# Patient Record
Sex: Female | Born: 1980 | Race: Black or African American | Hispanic: No | Marital: Single | Smoking: Former smoker
Health system: Southern US, Community
[De-identification: ages and names within clinical notes are randomized; demographics above are authoritative.]

## PROBLEM LIST (undated history)

## (undated) DIAGNOSIS — K279 Peptic ulcer, site unspecified, unspecified as acute or chronic, without hemorrhage or perforation: Secondary | ICD-10-CM

## (undated) DIAGNOSIS — Z8619 Personal history of other infectious and parasitic diseases: Secondary | ICD-10-CM

## (undated) DIAGNOSIS — N809 Endometriosis, unspecified: Secondary | ICD-10-CM

## (undated) DIAGNOSIS — F319 Bipolar disorder, unspecified: Secondary | ICD-10-CM

## (undated) HISTORY — DX: Personal history of other infectious and parasitic diseases: Z86.19

## (undated) HISTORY — PX: COLPOSCOPY: SHX161

## (undated) HISTORY — DX: Endometriosis, unspecified: N80.9

## (undated) HISTORY — DX: Bipolar disorder, unspecified: F31.9

## (undated) HISTORY — DX: Peptic ulcer, site unspecified, unspecified as acute or chronic, without hemorrhage or perforation: K27.9

---

## 1999-06-16 ENCOUNTER — Emergency Department (HOSPITAL_COMMUNITY): Admission: EM | Admit: 1999-06-16 | Discharge: 1999-06-17 | Payer: Self-pay | Admitting: Emergency Medicine

## 1999-08-25 ENCOUNTER — Emergency Department (HOSPITAL_COMMUNITY): Admission: EM | Admit: 1999-08-25 | Discharge: 1999-08-25 | Payer: Self-pay | Admitting: *Deleted

## 1999-12-17 ENCOUNTER — Other Ambulatory Visit: Admission: RE | Admit: 1999-12-17 | Discharge: 1999-12-17 | Payer: Self-pay | Admitting: Family Medicine

## 2000-03-09 HISTORY — PX: INDUCED ABORTION: SHX677

## 2000-06-16 ENCOUNTER — Inpatient Hospital Stay (HOSPITAL_COMMUNITY): Admission: AD | Admit: 2000-06-16 | Discharge: 2000-06-16 | Payer: Self-pay | Admitting: Obstetrics

## 2000-06-19 ENCOUNTER — Inpatient Hospital Stay (HOSPITAL_COMMUNITY): Admission: AD | Admit: 2000-06-19 | Discharge: 2000-06-19 | Payer: Self-pay | Admitting: Obstetrics

## 2002-05-11 ENCOUNTER — Emergency Department (HOSPITAL_COMMUNITY): Admission: EM | Admit: 2002-05-11 | Discharge: 2002-05-11 | Payer: Self-pay | Admitting: Emergency Medicine

## 2003-02-26 ENCOUNTER — Emergency Department (HOSPITAL_COMMUNITY): Admission: EM | Admit: 2003-02-26 | Discharge: 2003-02-26 | Payer: Self-pay | Admitting: Emergency Medicine

## 2003-08-17 ENCOUNTER — Inpatient Hospital Stay (HOSPITAL_COMMUNITY): Admission: AD | Admit: 2003-08-17 | Discharge: 2003-08-17 | Payer: Self-pay | Admitting: *Deleted

## 2004-10-07 ENCOUNTER — Ambulatory Visit: Payer: Self-pay | Admitting: Family Medicine

## 2004-10-07 ENCOUNTER — Other Ambulatory Visit: Admission: RE | Admit: 2004-10-07 | Discharge: 2004-10-07 | Payer: Self-pay | Admitting: Family Medicine

## 2004-12-23 ENCOUNTER — Ambulatory Visit: Payer: Self-pay | Admitting: Family Medicine

## 2005-02-26 ENCOUNTER — Ambulatory Visit: Payer: Self-pay | Admitting: Family Medicine

## 2005-03-05 ENCOUNTER — Ambulatory Visit: Payer: Self-pay | Admitting: Family Medicine

## 2005-03-16 ENCOUNTER — Ambulatory Visit: Payer: Self-pay | Admitting: Family Medicine

## 2005-06-15 ENCOUNTER — Ambulatory Visit: Payer: Self-pay | Admitting: Family Medicine

## 2005-09-14 ENCOUNTER — Ambulatory Visit: Payer: Self-pay | Admitting: Family Medicine

## 2005-10-13 ENCOUNTER — Emergency Department: Payer: Self-pay

## 2005-10-13 ENCOUNTER — Other Ambulatory Visit: Payer: Self-pay

## 2005-11-11 ENCOUNTER — Emergency Department (HOSPITAL_COMMUNITY): Admission: EM | Admit: 2005-11-11 | Discharge: 2005-11-11 | Payer: Self-pay | Admitting: Emergency Medicine

## 2005-11-13 ENCOUNTER — Ambulatory Visit: Payer: Self-pay | Admitting: Family Medicine

## 2005-11-24 ENCOUNTER — Ambulatory Visit: Payer: Self-pay | Admitting: Family Medicine

## 2005-12-14 ENCOUNTER — Ambulatory Visit: Payer: Self-pay | Admitting: Family Medicine

## 2005-12-14 ENCOUNTER — Other Ambulatory Visit: Admission: RE | Admit: 2005-12-14 | Discharge: 2005-12-14 | Payer: Self-pay | Admitting: Family Medicine

## 2005-12-14 ENCOUNTER — Encounter: Payer: Self-pay | Admitting: Family Medicine

## 2005-12-14 LAB — CONVERTED CEMR LAB: Pap Smear: NORMAL

## 2006-03-15 ENCOUNTER — Ambulatory Visit: Payer: Self-pay | Admitting: Family Medicine

## 2006-04-15 ENCOUNTER — Ambulatory Visit: Payer: Self-pay | Admitting: Family Medicine

## 2006-06-07 ENCOUNTER — Ambulatory Visit: Payer: Self-pay | Admitting: Family Medicine

## 2006-08-19 ENCOUNTER — Encounter: Payer: Self-pay | Admitting: Family Medicine

## 2006-08-19 DIAGNOSIS — K3189 Other diseases of stomach and duodenum: Secondary | ICD-10-CM | POA: Insufficient documentation

## 2006-08-19 DIAGNOSIS — R1013 Epigastric pain: Secondary | ICD-10-CM

## 2006-08-23 ENCOUNTER — Ambulatory Visit: Payer: Self-pay | Admitting: Family Medicine

## 2006-11-15 ENCOUNTER — Ambulatory Visit: Payer: Self-pay | Admitting: Family Medicine

## 2007-01-17 ENCOUNTER — Ambulatory Visit: Payer: Self-pay | Admitting: Family Medicine

## 2007-01-17 DIAGNOSIS — N760 Acute vaginitis: Secondary | ICD-10-CM | POA: Insufficient documentation

## 2007-01-17 DIAGNOSIS — R300 Dysuria: Secondary | ICD-10-CM | POA: Insufficient documentation

## 2007-01-17 LAB — CONVERTED CEMR LAB
Bilirubin Urine: NEGATIVE
Blood in Urine, dipstick: NEGATIVE
Casts: 0 /lpf
Glucose, Urine, Semiquant: NEGATIVE
Ketones, urine, test strip: NEGATIVE
Nitrite: NEGATIVE
RBC / HPF: 0
Specific Gravity, Urine: >=1.03
Urine crystals, microscopic: 0 /hpf
Urobilinogen, UA: 0.2
WBC Urine, dipstick: NEGATIVE
Yeast, UA: 0
pH: 6

## 2007-01-18 ENCOUNTER — Encounter: Payer: Self-pay | Admitting: Family Medicine

## 2007-01-19 LAB — CONVERTED CEMR LAB
Chlamydia, DNA Probe: NEGATIVE
GC Probe Amp, Genital: NEGATIVE

## 2007-02-14 ENCOUNTER — Other Ambulatory Visit: Admission: RE | Admit: 2007-02-14 | Discharge: 2007-02-14 | Payer: Self-pay | Admitting: Family Medicine

## 2007-02-14 ENCOUNTER — Ambulatory Visit: Payer: Self-pay | Admitting: Family Medicine

## 2007-02-14 ENCOUNTER — Encounter: Payer: Self-pay | Admitting: Family Medicine

## 2007-02-14 DIAGNOSIS — Z87891 Personal history of nicotine dependence: Secondary | ICD-10-CM | POA: Insufficient documentation

## 2007-02-15 LAB — CONVERTED CEMR LAB
ALT: 35 units/L (ref 0–35)
AST: 28 units/L (ref 0–37)
Albumin: 4 g/dL (ref 3.5–5.2)
Alkaline Phosphatase: 61 units/L (ref 39–117)
BUN: 10 mg/dL (ref 6–23)
Basophils Absolute: 0 10*3/uL (ref 0.0–0.1)
Basophils Relative: 0.1 % (ref 0.0–1.0)
Bilirubin, Direct: 0.1 mg/dL (ref 0.0–0.3)
CO2: 32 meq/L (ref 19–32)
Calcium: 9.7 mg/dL (ref 8.4–10.5)
Chloride: 95 meq/L — ABNORMAL LOW (ref 96–112)
Cholesterol: 263 mg/dL (ref 0–200)
Creatinine, Ser: 0.8 mg/dL (ref 0.4–1.2)
Direct LDL: 180 mg/dL
Eosinophils Absolute: 0 10*3/uL (ref 0.0–0.6)
Eosinophils Relative: 0.2 % (ref 0.0–5.0)
GFR calc Af Amer: 110 mL/min
GFR calc non Af Amer: 91 mL/min
Glucose, Bld: 86 mg/dL (ref 70–99)
HCT: 39.1 % (ref 36.0–46.0)
HDL: 62.7 mg/dL (ref >39.0)
Hemoglobin: 13.4 g/dL (ref 12.0–15.0)
Lymphocytes Relative: 40.2 % (ref 12.0–46.0)
MCHC: 34.4 g/dL (ref 30.0–36.0)
MCV: 96 fL (ref 78.0–100.0)
Monocytes Absolute: 0.6 10*3/uL (ref 0.2–0.7)
Monocytes Relative: 9.2 % (ref 3.0–11.0)
Neutro Abs: 3.3 10*3/uL (ref 1.4–7.7)
Neutrophils Relative %: 50.3 % (ref 43.0–77.0)
Platelets: 273 10*3/uL (ref 150–400)
Potassium: 4.5 meq/L (ref 3.5–5.1)
RBC: 4.07 M/uL (ref 3.87–5.11)
RDW: 13.1 % (ref 11.5–14.6)
Sodium: 132 meq/L — ABNORMAL LOW (ref 135–145)
TSH: 0.6 microintl units/mL (ref 0.35–5.50)
Total Bilirubin: 0.6 mg/dL (ref 0.3–1.2)
Total CHOL/HDL Ratio: 4.2
Total Protein: 6.9 g/dL (ref 6.0–8.3)
Triglycerides: 107 mg/dL (ref 0–149)
VLDL: 21 mg/dL (ref 0–40)
WBC: 6.5 10*3/uL (ref 4.5–10.5)

## 2007-02-17 ENCOUNTER — Encounter (INDEPENDENT_AMBULATORY_CARE_PROVIDER_SITE_OTHER): Payer: Self-pay | Admitting: *Deleted

## 2007-02-17 LAB — CONVERTED CEMR LAB: Pap Smear: NORMAL

## 2007-11-24 ENCOUNTER — Encounter: Payer: Self-pay | Admitting: Family Medicine

## 2008-01-20 ENCOUNTER — Inpatient Hospital Stay (HOSPITAL_COMMUNITY): Admission: AD | Admit: 2008-01-20 | Discharge: 2008-01-20 | Payer: Self-pay | Admitting: Obstetrics & Gynecology

## 2008-02-20 ENCOUNTER — Inpatient Hospital Stay (HOSPITAL_COMMUNITY): Admission: AD | Admit: 2008-02-20 | Discharge: 2008-02-20 | Payer: Self-pay | Admitting: Obstetrics and Gynecology

## 2008-09-15 ENCOUNTER — Inpatient Hospital Stay (HOSPITAL_COMMUNITY): Admission: AD | Admit: 2008-09-15 | Discharge: 2008-09-17 | Payer: Self-pay | Admitting: Obstetrics & Gynecology

## 2009-10-18 IMAGING — US US OB COMP LESS 14 WK
1 series · 14 of 28 positions shown · non-contrast
Comparison: None

CLINICAL DATA: *Abdominal pain Pregnancy; ; ABDOMINAL PAIN.

OBSTETRIC <14 WK US AND TRANSVAGINAL OB US
TECHNIQUE: Both transabdominal and transvaginal ultrasound
examinations were performed for complete evaluation of the
gestation as well as the maternal uterus, adnexal regions, and
pelvic cul-de-sac.

[Series 1: us ob comp less 14 wk · 0.26mm/px · 14 of 41 slices shown]
[im 2/41]
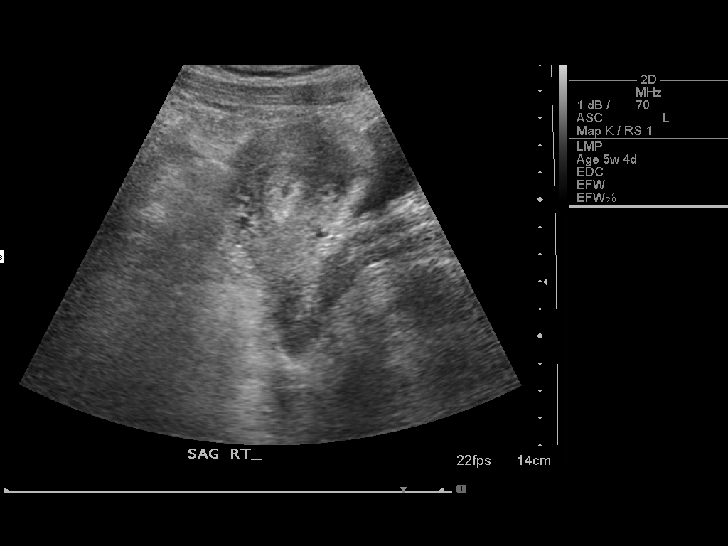
[im 5/41]
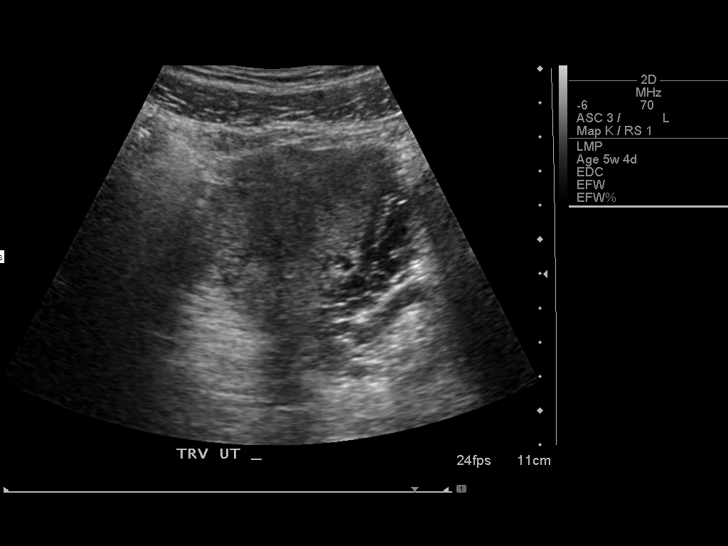
[im 8/41]
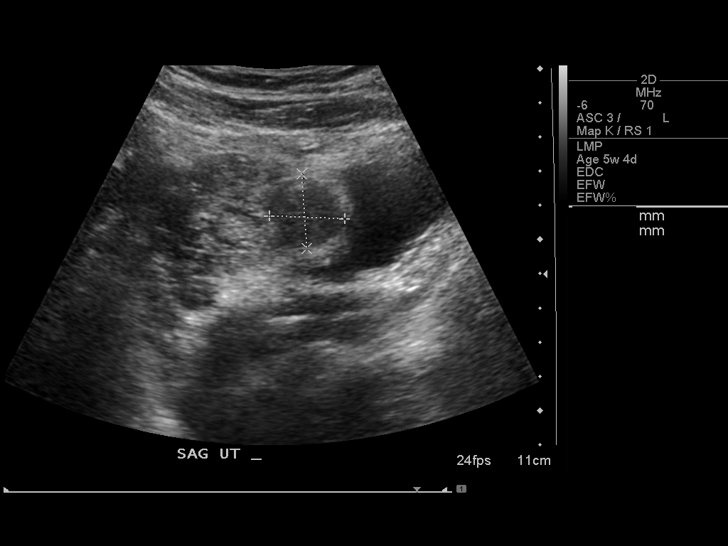
[im 11/41]
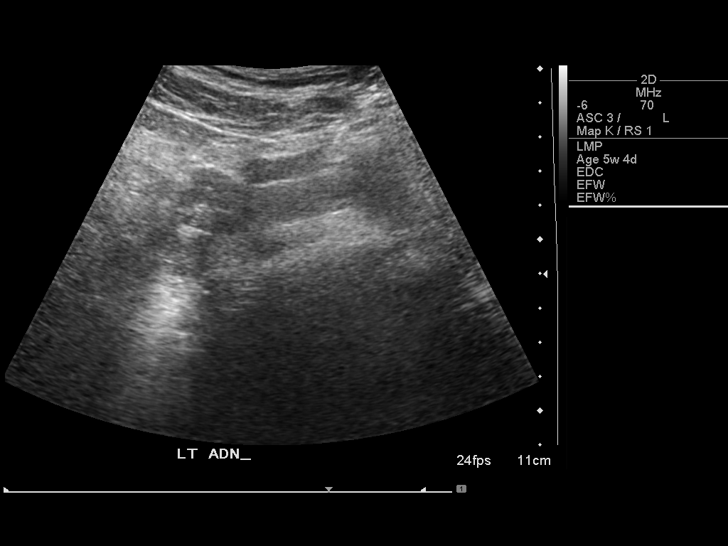
[im 14/41]
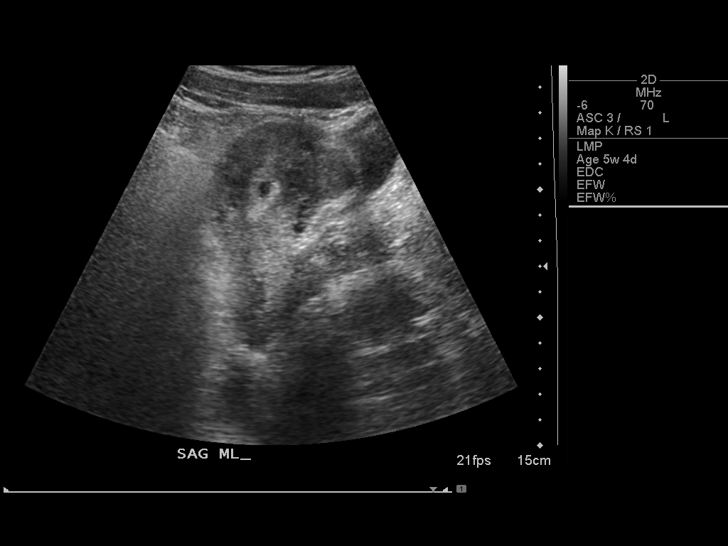
[im 17/41]
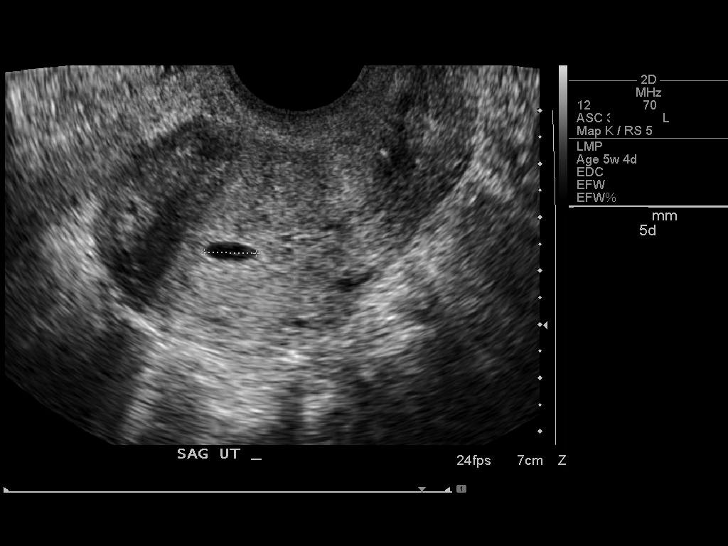
[im 20/41]
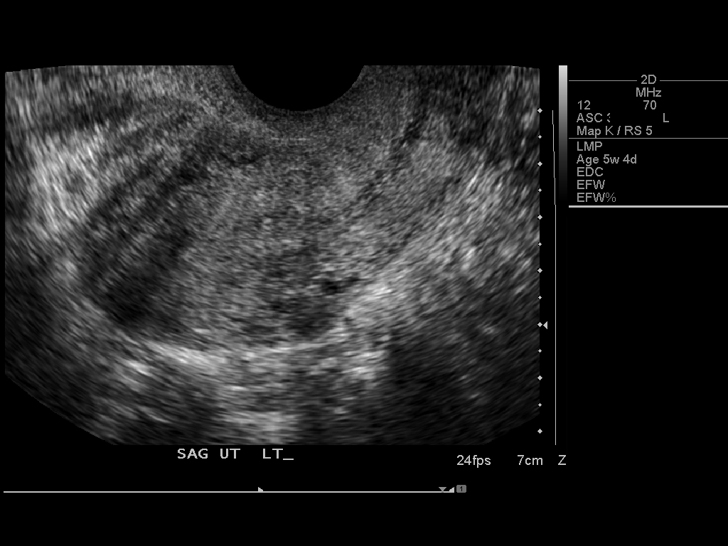
[im 23/41]
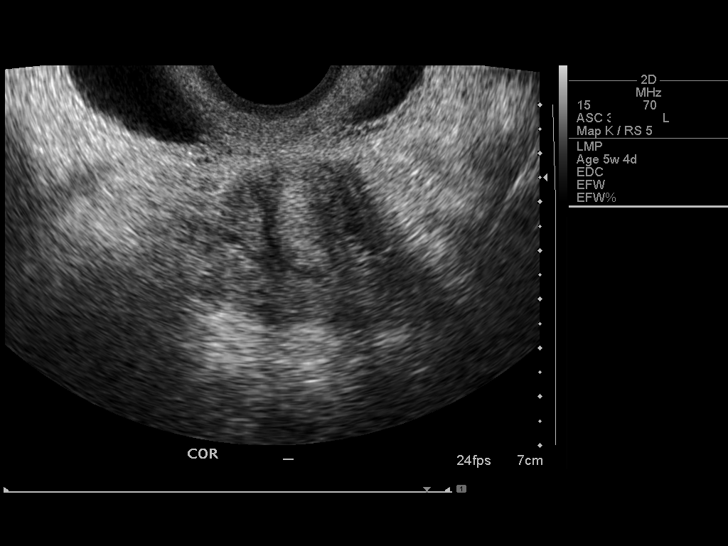
[im 26/41]
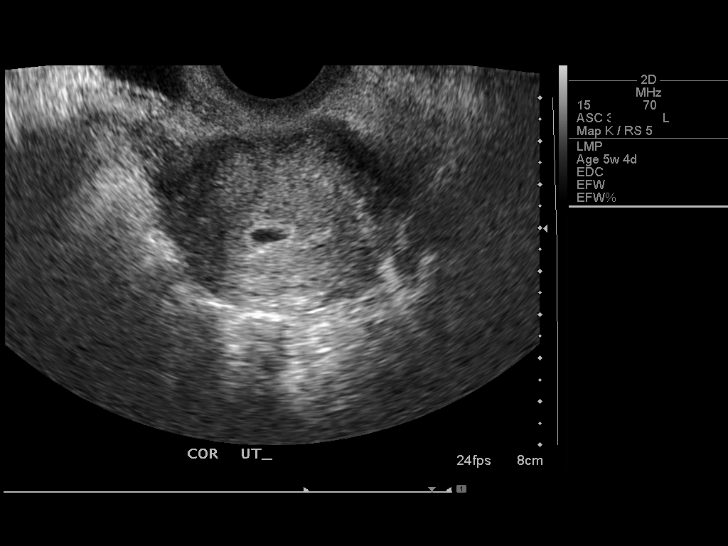
[im 29/41]
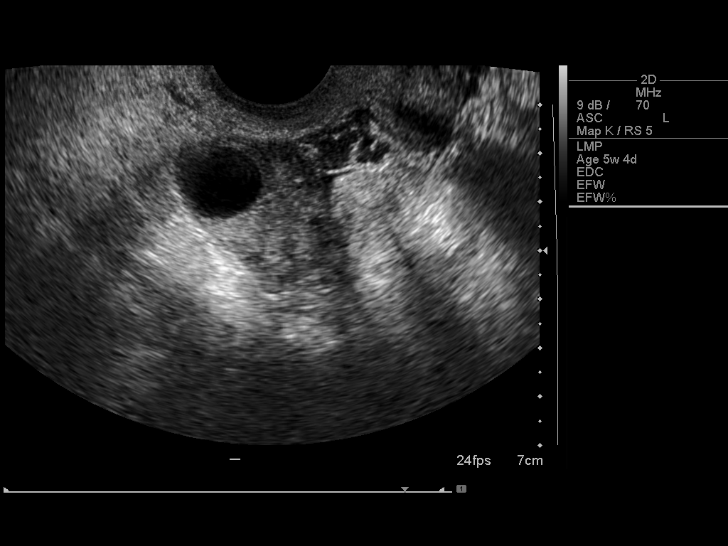
[im 32/41]
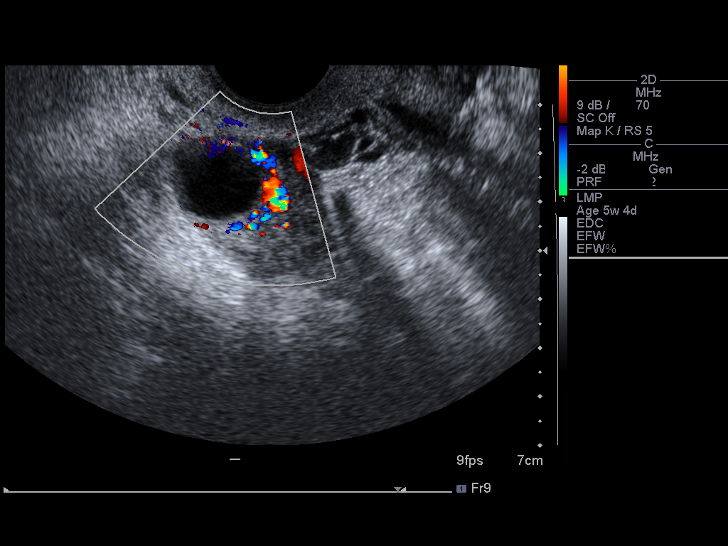
[im 35/41]
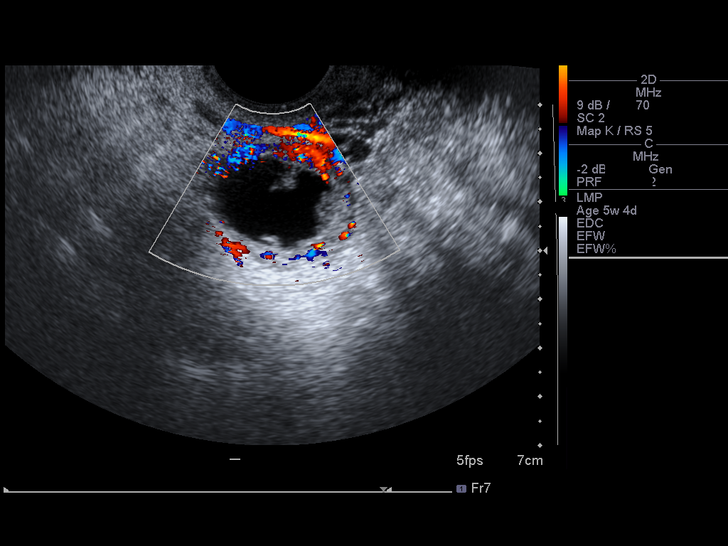
[im 38/41]
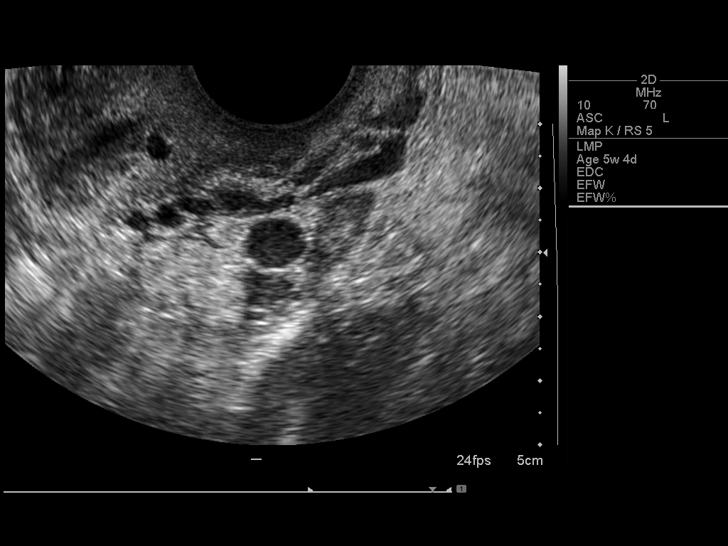
[im 41/41]
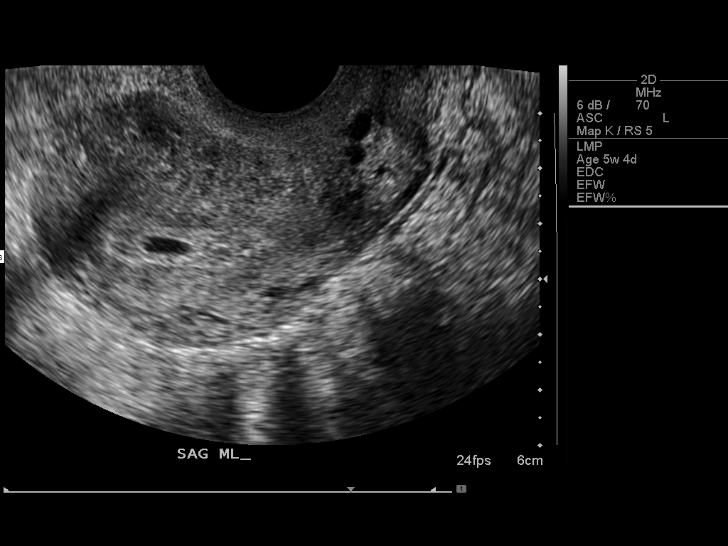

[14 of 28 positions shown; findings below may reference images not displayed]

FINDINGS: There is a single intrauterine gestation.  Mean sac
diameter is 7.4 mm for estimated gestational age of 5 weeks 3 days.
Yolk sac is visualized.  No embryo currently visible.  No
subchorionic hemorrhage.

Right corpus luteum cyst noted.  Ovaries unremarkable.  No free
fluid.

There is a 2.2 cm left fundal fibroid noted.
IMPRESSION: Early intrauterine pregnancy, 5 weeks 3 days by mean sac diameter.
Embryo not visualized currently.

## 2009-11-18 IMAGING — US US OB TRANSVAGINAL
1 series · 17 of 17 positions shown · non-contrast
Comparison: none

OBSTETRICAL ULTRASOUND:
 This ultrasound exam was performed in the [HOSPITAL] Ultrasound Department.  The OB US report was generated in the AS system, and faxed to the ordering physician.  This report is also available in [REDACTED] PACS.

[Series 1: us ob comp less 14 wks · 17 of 17 slices shown]
[im 1/17]
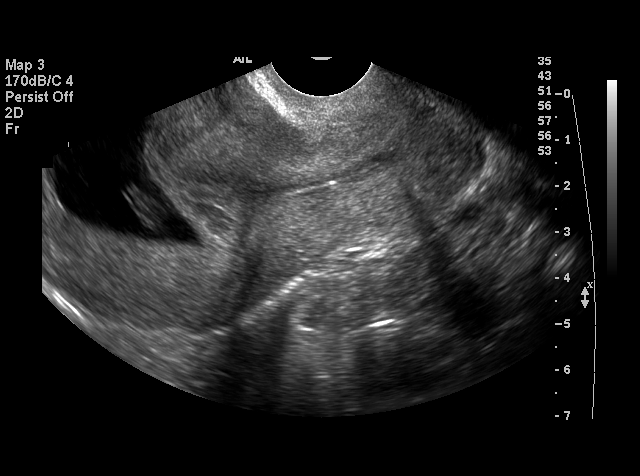
[im 2/17]
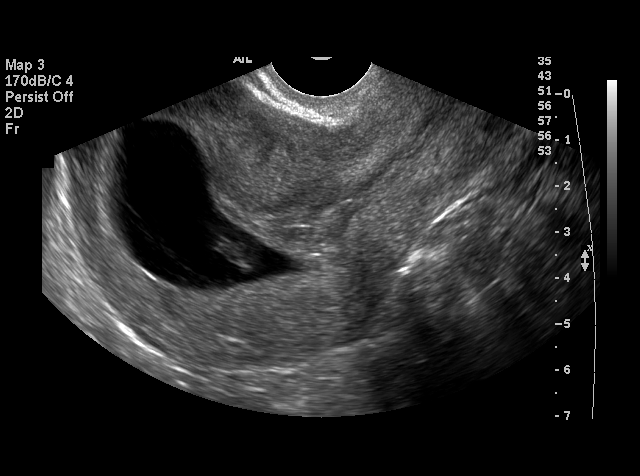
[im 3/17]
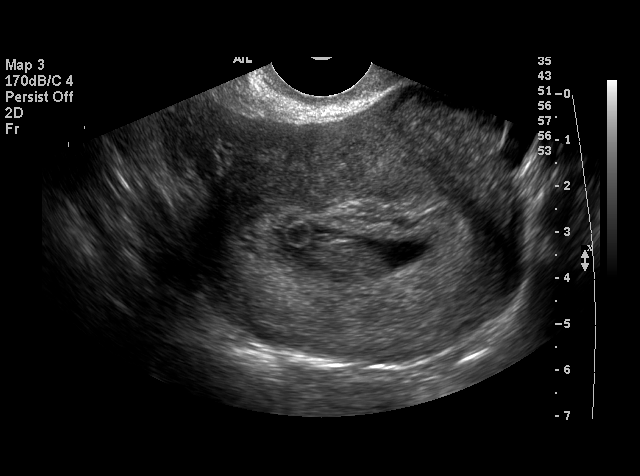
[im 4/17]
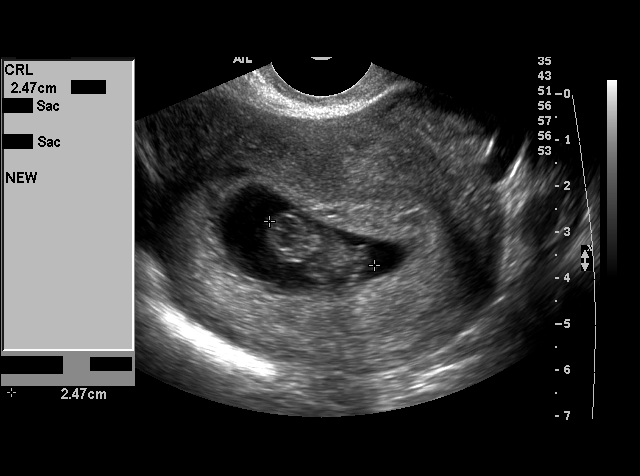
[im 5/17]
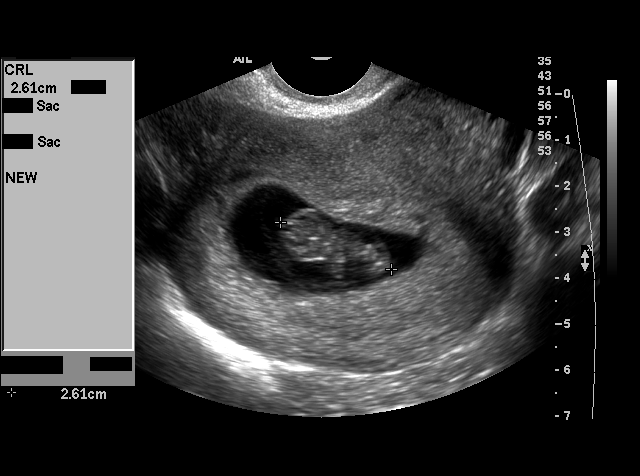
[im 6/17]
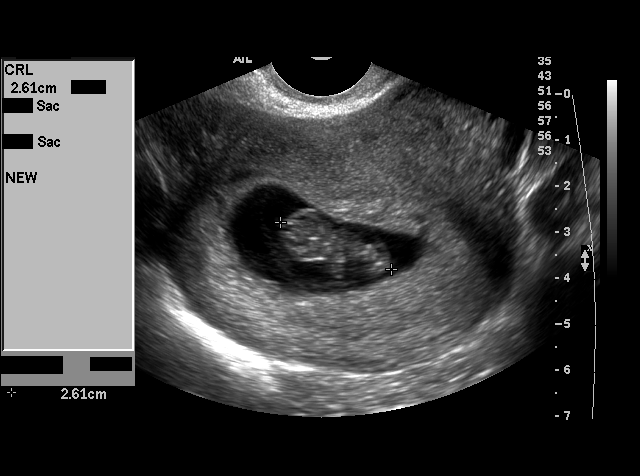
[im 7/17]
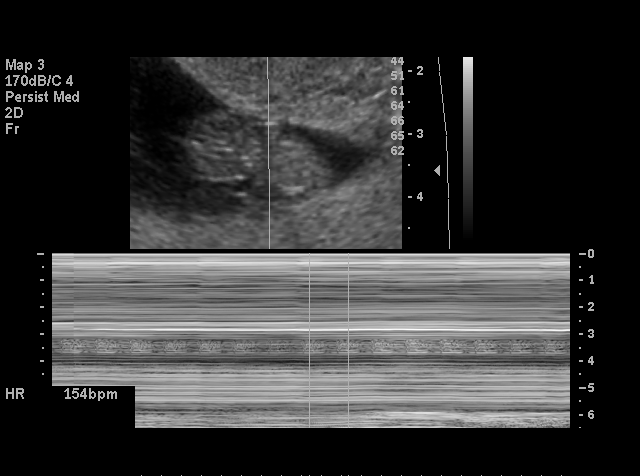
[im 8/17]
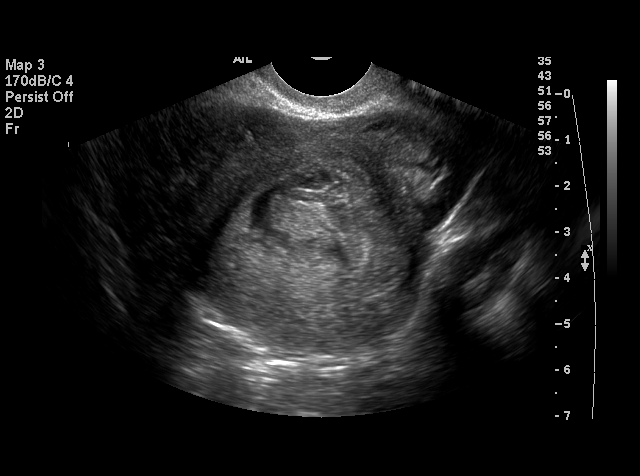
[im 9/17]
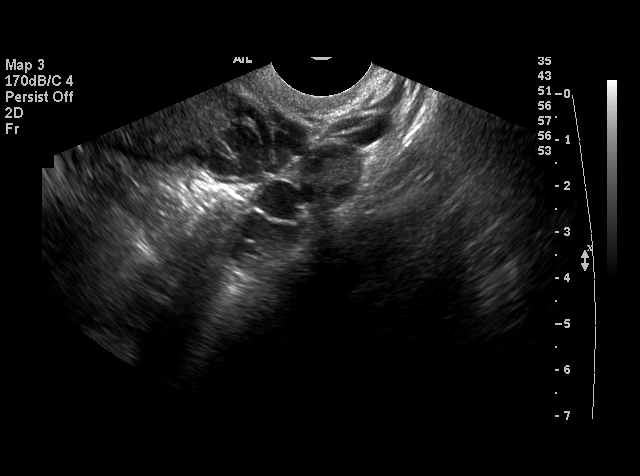
[im 10/17]
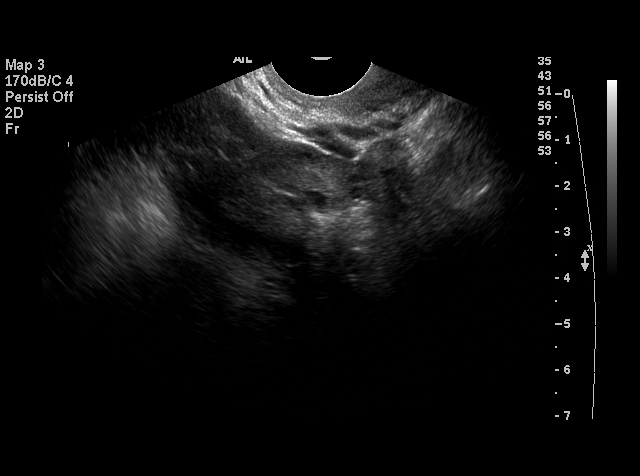
[im 11/17]
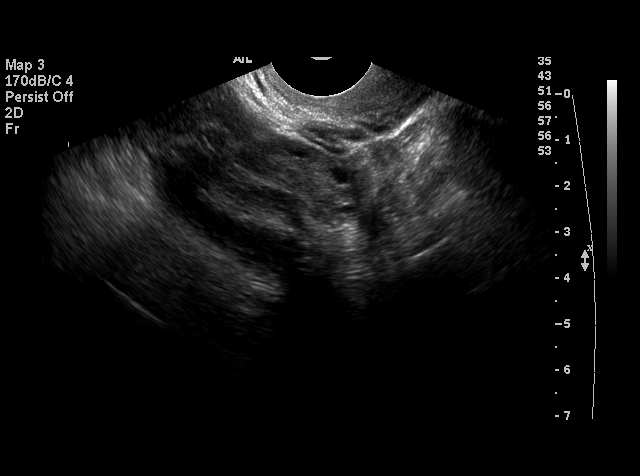
[im 12/17]
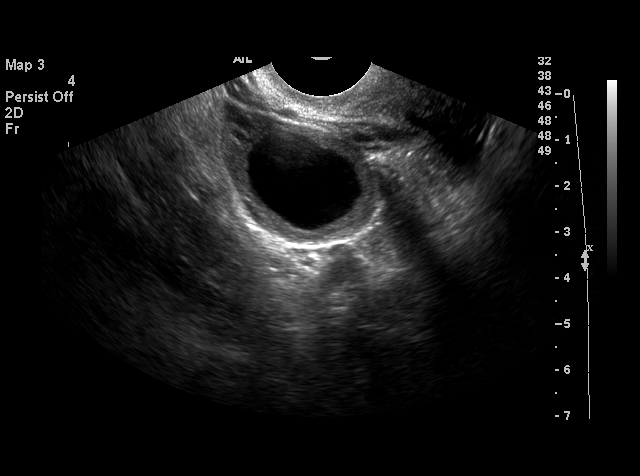
[im 13/17]
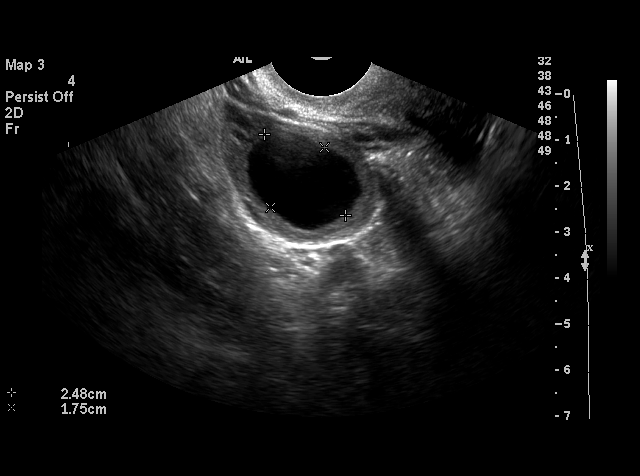
[im 14/17]
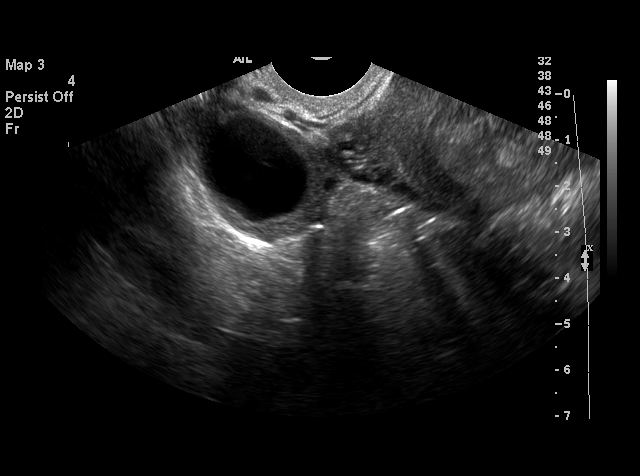
[im 15/17]
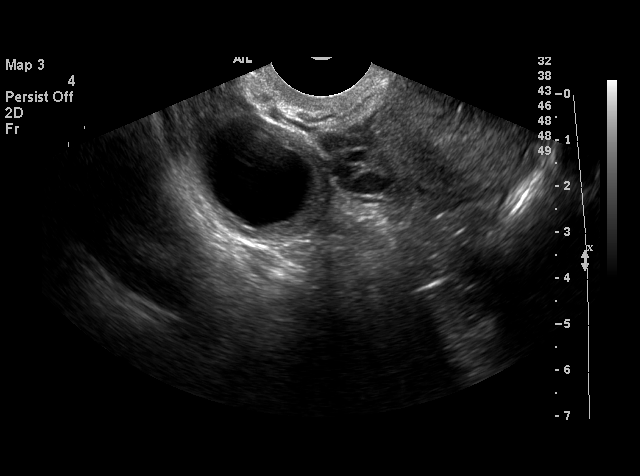
[im 16/17]
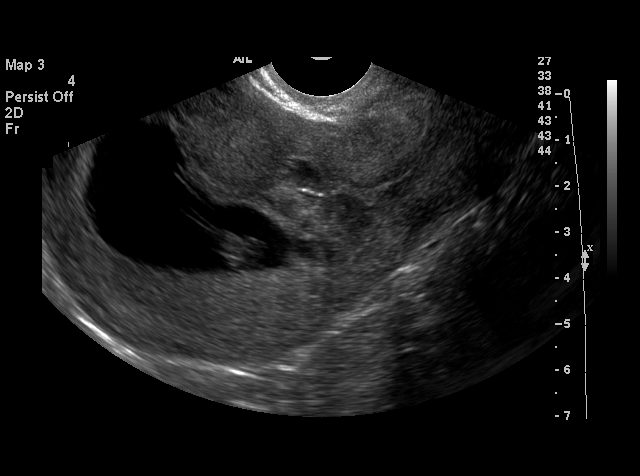
[im 17/17]
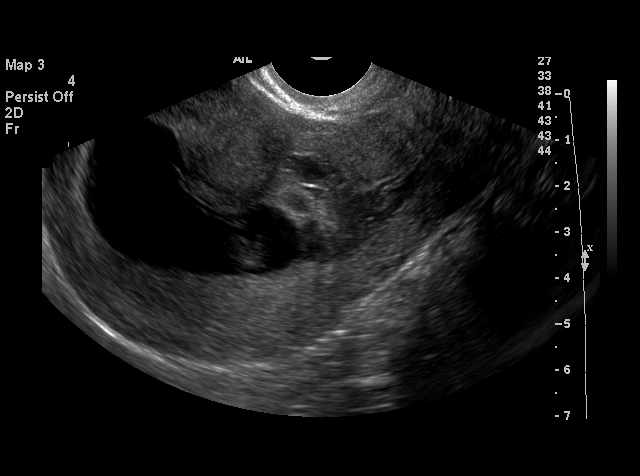

[17 of 17 positions shown; findings below may reference images not displayed]

IMPRESSION: See AS Obstetric US report.

## 2010-06-15 LAB — CBC
HCT: 37 % (ref 36.0–46.0)
HCT: 41.1 % (ref 36.0–46.0)
Hemoglobin: 12.8 g/dL (ref 12.0–15.0)
Hemoglobin: 14.3 g/dL (ref 12.0–15.0)
MCHC: 34.6 g/dL (ref 30.0–36.0)
MCHC: 34.8 g/dL (ref 30.0–36.0)
MCV: 95.4 fL (ref 78.0–100.0)
MCV: 96.5 fL (ref 78.0–100.0)
Platelets: 203 10*3/uL (ref 150–400)
Platelets: 218 10*3/uL (ref 150–400)
RBC: 3.84 MIL/uL — ABNORMAL LOW (ref 3.87–5.11)
RBC: 4.31 MIL/uL (ref 3.87–5.11)
RDW: 13.7 % (ref 11.5–15.5)
RDW: 14.1 % (ref 11.5–15.5)
WBC: 13.8 10*3/uL — ABNORMAL HIGH (ref 4.0–10.5)
WBC: 9.5 10*3/uL (ref 4.0–10.5)

## 2010-06-15 LAB — CCBB MATERNAL DONOR DRAW

## 2010-06-15 LAB — RPR: RPR Ser Ql: NONREACTIVE

## 2010-07-22 NOTE — H&P (Signed)
Waller, Jeanne               ACCOUNT NO.:  [id number]   MEDICAL RECORD NO.:  [id number]          PATIENT TYPE:  INP   LOCATION:  9169                          FACILITY:  [facility]   PHYSICIAN:  Janine Limbo, M.D.DATE OF BIRTH:  January 03, 1981   DATE OF ADMISSION:  09/15/2008  DATE OF DISCHARGE:                              HISTORY & PHYSICAL   Jeanne Waller is a 30 year old single black female, gravida 2, para 0-0-1-0  at 72 and 3 weeks per an [facility] of September 19, 2008, who presents in active  labor with positive show.  Denies leakage of fluid, PIH or UTI signs or  symptoms.  She reports good fetal movement.  She has been followed by  the [name] service at [facility].   History remarkable for:  1. Abnormal Pap at colposcopy in this pregnancy.  2. Fibroid on ultrasound.  3. History of ulcer in the past, takes Nexium as needed.  4. Bipolar.  She is currently on the Lamictal only, had in the      pregnancy been on Xanax and Tegretol in addition.  She is followed      by Dr. Evelene Croon.  5. History of marijuana and cigarette use in the pregnancy.  6. First trimester spotting.   PRENATAL LABS:  Her blood type is O+, Rh antibody screen was negative.  Sickle cell screen negative.  Toxo-screen negative.  RPR nonreactive,  rubella titer immune.  Hepatitis surface antigen negative, HIV  nonreactive.  Cystic fibrosis screen negative.  First trimester screen  was within normal limits.  She had a Pap smear done first trimester and  was high grade and did have a colpo.  Gonorrhea and chlamydia cultures  in November 2009, were negative.  Her hemoglobin on February 23, 2008,  was 13.3, hematocrit 39.1, platelets were 295.  GBS in her third  trimester is negative.   ALLERGIES:  SHE DENIES MEDICATION OR LATEX ALLERGIES.   MENSTRUAL HISTORY:  Menarche at age 21, monthly cycles, no  abnormalities.  Reported an LMP of December 13, 2007, given her best [facility]  of September 19, 2008.   OBSTETRICAL HISTORY:  Jeanne Waller 1 was an  induced abortion at [place], [redacted]  weeks gestation, that was in 2002.  Gravida 2 is current pregnancy.   PAST MEDICAL HISTORY:  For contraception in the past she has used  condoms, Depo-Provera, Ortho-Novum 7/7/7, Ortho Tri-Cyclen, Ortho Tri-  Cyclen Lo.  She had an abnormal Pap smear in 2003.  She did have a  colposcopy then.  She was followed every 6 months for 2 years and was  normal.  Pap smear was normal in September 2008.  She had a fibroid seen  on ultrasound on January 20, 2008.  Infrequent yeast infection.  Varicella as a child.  History of an ulcer, had used Nexium p.r.n.  The  patient is bipolar, followed by Dr. Evelene Croon.  Cigarette use during the  pregnancy, some marijuana use.   SURGICAL HISTORY:  Remarkable for wisdom teeth in 1996, extracted.   GENETIC HISTORY:  Remarkable for the patient's paternal first cousin  with mental  retardation.   FAMILY HISTORY:  Maternal uncle and maternal grandfather heart disease.  Maternal grandfather, maternal grandmother, maternal aunts and uncles,  paternal grandfather, paternal grandmother, paternal aunts and uncles  with high blood pressure.  Maternal aunt varicose veins.  Mom anemia.  Maternal uncle COPD.  Maternal grandmother, maternal grandfather,  maternal aunts and uncles and paternal grandparents diabetes.  Maternal  first cousin dialysis.  Dad with questionable dementia.  Maternal  grandmother cervical cancer.   SOCIAL HISTORY:  She is a single [place] American female.  She is of  [place] faith.  Father of baby's name is Jeanne Waller.  The patient  is a sophomore in college.  Father of the baby is a Teacher, adult education  and has had high school education.  She before being aware of pregnancy  did have some alcohol, usually about 5 cigarettes a day at early  pregnancy and had, had some marijuana early on in the pregnancy.   HISTORY OF PRESENT PREGNANCY:  The patient entered care for a new OB  interview on January 30, 2008, and was  around [redacted] weeks pregnant.  She  returned for her new OB workup at 10 weeks and 3 days, was normotensive.  Her weight was 168.  She reported a pre-gravid weight of 162.  She is 5  foot 8.  She did have some first trimester spotting.  She continued  being followed by Dr. Evelene Croon regarding her bipolar.  She had HSIL on her  Pap smear and did have a colposcopy on March 13, 2008, by Dr. Pennie Rushing,  and plan was to repeat that at 28 weeks.  She had a normal first  trimester screen.  Anatomy ultrasound at 18-6/7 weeks, single  intrauterine pregnancy with normal anatomy, growth and development.  Cervix was 3.21 cm, posterior placenta.  She did not have loss of one of  her maternal uncles around 23 weeks and had some increased stress, but  did cope well.  Glucola at 26 weeks was within normal limits, equal to  103.  RPR nonreactive.  Hemoglobin was 11.4.  That does concludes the  prenatal record that we have available at the time of this dictation,  but per patient the pregnancy has continued without any other  significant complications.   PHYSICAL EXAMINATION:  VITAL SIGNS:  On admission 127/78, heart rate 76,  respirations 18.  She is afebrile.  Her weight today is 217.  Fetal  heart rate in the 130s, reactive, moderate variability, has had 2-3  early decelerations.  Toco, uterine contractions every 2-4 minutes,  moderate on palpation.  GENERAL:  She does have noted discomfort with her contractions, but is  alert and oriented x3 and pleasant in between.  HEENT:  Within normal limits, grossly intact.  She is wearing glasses.  CARDIOVASCULAR:  Regular rate and rhythm without murmur.  LUNGS:  Clear to auscultation bilaterally.  ABDOMEN:  Soft, nontender and gravid.  Cervix 4 cm, 100%, -1 station  with bulging membranes.  EXTREMITIES:  Just some trace to mild  generalized edema, no clonus.  DTRs 1+.   IMPRESSION:  1. Intrauterine pregnancy at 39/3.  2. Group B strep negative.  3. Early active  labor.  4. Bipolar.  5. Reactive fetal heart tracing.   PLAN:  1. Admit to birthing suites with Dr. Leonard Schwartz as      attending physician.  2. Routine [name] orders.  3. The patient declines epidural at present, but is amenable to Stadol  as needed for pain.  4. The patient is also agreeable for artificial rupture of membranes      for augmentation if no cervical change.      Candice Denny Levy, [place]      Janine Limbo, M.D.  Electronically Signed    CHS/MEDQ  D:  09/15/2008  T:  09/15/2008  Job:  [id number]
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
## 2010-10-29 ENCOUNTER — Encounter: Payer: Self-pay | Admitting: Family Medicine

## 2010-10-30 ENCOUNTER — Ambulatory Visit (INDEPENDENT_AMBULATORY_CARE_PROVIDER_SITE_OTHER): Payer: BC Managed Care – PPO | Admitting: Family Medicine

## 2010-10-30 ENCOUNTER — Encounter: Payer: Self-pay | Admitting: Family Medicine

## 2010-10-30 DIAGNOSIS — F319 Bipolar disorder, unspecified: Secondary | ICD-10-CM | POA: Insufficient documentation

## 2010-10-30 DIAGNOSIS — Z0000 Encounter for general adult medical examination without abnormal findings: Secondary | ICD-10-CM | POA: Insufficient documentation

## 2010-10-30 DIAGNOSIS — Z0289 Encounter for other administrative examinations: Secondary | ICD-10-CM

## 2010-10-30 DIAGNOSIS — Z87891 Personal history of nicotine dependence: Secondary | ICD-10-CM

## 2010-10-30 NOTE — Assessment & Plan Note (Addendum)
Reviewed preventative protocols. Pt to check with previous schools re immunization recs as none available here (reviewed old record). Discussed healthy diet and importance of activity in routine. To return for PPD as well. reviewed old chart as well.

## 2010-10-30 NOTE — Patient Instructions (Signed)
Check with [facility] or [facility] for immunization records (MMR, Hep B series) and let us know. If they don't have records, let us know to add on titers when you return fasting for blood work. Return fasting for blood work in next few weeks at your convenience. Pleasure to see you today!  Call us with questions.

## 2010-10-30 NOTE — Progress Notes (Signed)
Subjective:    Patient ID: Jeanne Waller, female    DOB: 05-Jan-1981, 30 y.o.   MRN: [id number]  HPI CC: transfer care from Dr. [name], CPE  Hasn't been seen since 2008.  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  Preventative: Tdap 01/2010, at [facility]. Last blood work unsure.   Has had chicken pox as child. Unsure about MMR, Hep B. Well woman exam [facility], 05/2009.  IUD mirena in place, placed 2011. Regular cycles.  Medications and allergies reviewed and updated in chart.  Past histories reviewed and updated if relevant as below. Patient Active Problem List  Diagnoses  . TOBACCO USE  . DEPRESSION  . DYSPEPSIA  . VAGINITIS, BACTERIAL  . DYSURIA   Past Medical History  Diagnosis Date  . Bipolar disorder   . PUD (peptic ulcer disease)     controlled with nexium  . History of chicken pox    Past Surgical History  Procedure Date  . Colposcopy     abnormal pap - positive HPV   History  Substance Use Topics  . Smoking status: Former Smoker    Quit date: 03/10/2007  . Smokeless tobacco: Never Used  . Alcohol Use: Yes     rare   Family History  Problem Relation Age of Onset  . Hypertension Sister   . Diabetes Maternal Aunt   . Heart disease Maternal Uncle   . Diabetes Maternal Grandmother   . Cancer Maternal Grandmother     cervical cancer  . Heart disease Maternal Grandmother   . Stroke Neg Hx   . Coronary artery disease Neg Hx    No Known Allergies Current Outpatient Prescriptions on File Prior to Visit  Medication Sig Dispense Refill  . levonorgestrel (MIRENA) 20 MCG/24HR IUD 1 each by Intrauterine route once.         Review of Systems  Constitutional: Negative for fever, chills, activity change, appetite change, fatigue and unexpected weight change.  HENT: Negative for hearing loss and neck pain.   Eyes: Negative for visual disturbance.  Respiratory: Negative for cough, chest tightness, shortness of  breath and wheezing.   Cardiovascular: Negative for chest pain, palpitations and leg swelling.  Gastrointestinal: Negative for nausea, vomiting, abdominal pain, diarrhea, constipation, blood in stool and abdominal distention.  Genitourinary: Negative for hematuria and difficulty urinating.  Musculoskeletal: Positive for back pain. Negative for myalgias and arthralgias.  Skin: Negative for rash.  Neurological: Negative for dizziness, seizures, syncope and headaches.  Hematological: Does not bruise/bleed easily.  Psychiatric/Behavioral: Negative for dysphoric mood. The patient is not nervous/anxious.        Objective:   Physical Exam  Nursing note and vitals reviewed. Constitutional: She is oriented to person, place, and time. She appears well-developed and well-nourished. No distress.  HENT:  Head: Normocephalic and atraumatic.  Right Ear: Hearing and external ear normal.  Left Ear: Hearing, tympanic membrane, external ear and ear canal normal.  Nose: Nose normal. No mucosal edema or rhinorrhea.  Mouth/Throat: Uvula is midline and oropharynx is clear and moist. No oropharyngeal exudate, posterior oropharyngeal edema, posterior oropharyngeal erythema or tonsillar abscesses.       R TM occluded by cerumen but hearing intact  Eyes: Conjunctivae and EOM are normal. Pupils are equal, round, and reactive to light. No scleral icterus.  Neck: Normal range of motion. Neck supple.  Cardiovascular: Normal rate, regular rhythm, normal heart sounds and intact distal pulses.   No murmur  heard. Pulses:      Radial pulses are 2+ on the right side, and 2+ on the left side.  Pulmonary/Chest: Effort normal and breath sounds normal. No respiratory distress. She has no wheezes. She has no rales.  Abdominal: Soft. Bowel sounds are normal. She exhibits no distension and no mass. There is no tenderness. There is no rebound and no guarding.  Musculoskeletal: Normal range of motion.  Lymphadenopathy:    She  has no cervical adenopathy.  Neurological: She is alert and oriented to person, place, and time.       CN grossly intact, station and gait intact  Skin: Skin is warm and dry. No rash noted.  Psychiatric: She has a normal mood and affect. Her behavior is normal. Judgment and thought content normal.          Assessment & Plan:

## 2010-11-03 ENCOUNTER — Other Ambulatory Visit: Payer: BC Managed Care – PPO

## 2010-11-04 ENCOUNTER — Other Ambulatory Visit (INDEPENDENT_AMBULATORY_CARE_PROVIDER_SITE_OTHER): Payer: BC Managed Care – PPO

## 2010-11-04 ENCOUNTER — Ambulatory Visit (INDEPENDENT_AMBULATORY_CARE_PROVIDER_SITE_OTHER): Payer: BC Managed Care – PPO | Admitting: *Deleted

## 2010-11-04 DIAGNOSIS — Z111 Encounter for screening for respiratory tuberculosis: Secondary | ICD-10-CM

## 2010-11-04 DIAGNOSIS — Z0000 Encounter for general adult medical examination without abnormal findings: Secondary | ICD-10-CM

## 2010-11-04 LAB — COMPREHENSIVE METABOLIC PANEL
ALT: 20 U/L (ref 0–35)
AST: 21 U/L (ref 0–37)
Albumin: 4.5 g/dL (ref 3.5–5.2)
Alkaline Phosphatase: 59 U/L (ref 39–117)
BUN: 12 mg/dL (ref 6–23)
CO2: 26 mEq/L (ref 19–32)
Calcium: 9 mg/dL (ref 8.4–10.5)
Chloride: 103 mEq/L (ref 96–112)
Creatinine, Ser: 0.7 mg/dL (ref 0.4–1.2)
GFR: 118.59 mL/min (ref >60.00)
Glucose, Bld: 89 mg/dL (ref 70–99)
Potassium: 4.5 mEq/L (ref 3.5–5.1)
Sodium: 138 mEq/L (ref 135–145)
Total Bilirubin: 0.3 mg/dL (ref 0.3–1.2)
Total Protein: 7.7 g/dL (ref 6.0–8.3)

## 2010-11-04 LAB — LDL CHOLESTEROL, DIRECT: Direct LDL: 152 mg/dL

## 2010-11-04 LAB — CBC WITH DIFFERENTIAL/PLATELET
Basophils Absolute: 0 10*3/uL (ref 0.0–0.1)
Basophils Relative: 0.3 % (ref 0.0–3.0)
Eosinophils Absolute: 0.1 10*3/uL (ref 0.0–0.7)
Eosinophils Relative: 1.3 % (ref 0.0–5.0)
HCT: 40.2 % (ref 36.0–46.0)
Hemoglobin: 13.4 g/dL (ref 12.0–15.0)
Lymphocytes Relative: 42.1 % (ref 12.0–46.0)
Lymphs Abs: 2.5 10*3/uL (ref 0.7–4.0)
MCHC: 33.2 g/dL (ref 30.0–36.0)
MCV: 96.5 fl (ref 78.0–100.0)
Monocytes Absolute: 0.4 10*3/uL (ref 0.1–1.0)
Monocytes Relative: 6.4 % (ref 3.0–12.0)
Neutro Abs: 2.9 10*3/uL (ref 1.4–7.7)
Neutrophils Relative %: 49.9 % (ref 43.0–77.0)
Platelets: 279 10*3/uL (ref 150.0–400.0)
RBC: 4.17 Mil/uL (ref 3.87–5.11)
RDW: 13 % (ref 11.5–14.6)
WBC: 5.9 10*3/uL (ref 4.5–10.5)

## 2010-11-04 LAB — LIPID PANEL
Cholesterol: 205 mg/dL — ABNORMAL HIGH (ref 0–200)
HDL: 55.2 mg/dL (ref >39.00)
Total CHOL/HDL Ratio: 4
Triglycerides: 78 mg/dL (ref 0.0–149.0)
VLDL: 15.6 mg/dL (ref 0.0–40.0)

## 2010-11-04 LAB — TSH: TSH: 0.71 u[IU]/mL (ref 0.35–5.50)

## 2010-11-05 ENCOUNTER — Other Ambulatory Visit: Payer: Self-pay | Admitting: Family Medicine

## 2010-11-06 ENCOUNTER — Ambulatory Visit (INDEPENDENT_AMBULATORY_CARE_PROVIDER_SITE_OTHER): Payer: BC Managed Care – PPO | Admitting: Family Medicine

## 2010-11-06 ENCOUNTER — Ambulatory Visit: Payer: BC Managed Care – PPO

## 2010-11-06 DIAGNOSIS — Z23 Encounter for immunization: Secondary | ICD-10-CM

## 2010-11-06 DIAGNOSIS — Z0279 Encounter for issue of other medical certificate: Secondary | ICD-10-CM

## 2010-11-06 LAB — HEPATITIS B SURFACE ANTIBODY, QUANTITATIVE: Hep B S AB Quant (Post): 0 m[IU]/mL

## 2010-11-06 LAB — TB SKIN TEST: TB Skin Test: NEGATIVE mm

## 2010-12-09 LAB — POCT PREGNANCY, URINE: Preg Test, Ur: POSITIVE

## 2010-12-09 LAB — URINALYSIS, ROUTINE W REFLEX MICROSCOPIC
Bilirubin Urine: NEGATIVE
Glucose, UA: NEGATIVE
Hgb urine dipstick: NEGATIVE
Ketones, ur: NEGATIVE
Nitrite: NEGATIVE
Protein, ur: NEGATIVE
Specific Gravity, Urine: 1.01
Urobilinogen, UA: 0.2
pH: 6

## 2010-12-09 LAB — GC/CHLAMYDIA PROBE AMP, GENITAL: GC Probe Amp, Genital: NEGATIVE

## 2010-12-09 LAB — HCG, QUANTITATIVE, PREGNANCY: hCG, Beta Chain, Quant, S: 3934 — ABNORMAL HIGH

## 2010-12-09 LAB — CBC
Platelets: 295
RDW: 13.3

## 2010-12-09 LAB — ABO/RH: ABO/RH(D): O POS

## 2011-02-12 ENCOUNTER — Other Ambulatory Visit: Payer: Self-pay | Admitting: Family Medicine

## 2011-02-12 ENCOUNTER — Ambulatory Visit (INDEPENDENT_AMBULATORY_CARE_PROVIDER_SITE_OTHER): Payer: BC Managed Care – PPO | Admitting: *Deleted

## 2011-02-12 DIAGNOSIS — Z20820 Contact with and (suspected) exposure to varicella: Secondary | ICD-10-CM

## 2011-02-12 DIAGNOSIS — IMO0001 Reserved for inherently not codable concepts without codable children: Secondary | ICD-10-CM

## 2011-02-12 DIAGNOSIS — Z9189 Other specified personal risk factors, not elsewhere classified: Secondary | ICD-10-CM

## 2011-02-12 DIAGNOSIS — Z23 Encounter for immunization: Secondary | ICD-10-CM

## 2011-02-12 NOTE — Progress Notes (Signed)
Patient here for 2nd Hep B booster and flu vaccine. Both given without incident.

## 2011-04-29 ENCOUNTER — Ambulatory Visit (INDEPENDENT_AMBULATORY_CARE_PROVIDER_SITE_OTHER): Payer: BC Managed Care – PPO | Admitting: *Deleted

## 2011-04-29 DIAGNOSIS — Z111 Encounter for screening for respiratory tuberculosis: Secondary | ICD-10-CM

## 2011-05-01 ENCOUNTER — Encounter: Payer: Self-pay | Admitting: *Deleted

## 2011-06-08 ENCOUNTER — Encounter: Payer: Self-pay | Admitting: *Deleted

## 2011-06-14 ENCOUNTER — Emergency Department (HOSPITAL_COMMUNITY): Payer: No Typology Code available for payment source

## 2011-06-14 ENCOUNTER — Emergency Department (HOSPITAL_COMMUNITY)
Admission: EM | Admit: 2011-06-14 | Discharge: 2011-06-15 | Disposition: A | Discharge status: Home / Self Care | Payer: No Typology Code available for payment source | Attending: Emergency Medicine | Admitting: Emergency Medicine

## 2011-06-14 ENCOUNTER — Encounter (HOSPITAL_COMMUNITY): Payer: Self-pay | Admitting: Emergency Medicine

## 2011-06-14 DIAGNOSIS — M542 Cervicalgia: Secondary | ICD-10-CM | POA: Insufficient documentation

## 2011-06-14 DIAGNOSIS — F319 Bipolar disorder, unspecified: Secondary | ICD-10-CM | POA: Insufficient documentation

## 2011-06-14 DIAGNOSIS — S161XXA Strain of muscle, fascia and tendon at neck level, initial encounter: Secondary | ICD-10-CM

## 2011-06-14 DIAGNOSIS — K279 Peptic ulcer, site unspecified, unspecified as acute or chronic, without hemorrhage or perforation: Secondary | ICD-10-CM | POA: Insufficient documentation

## 2011-06-14 DIAGNOSIS — S139XXA Sprain of joints and ligaments of unspecified parts of neck, initial encounter: Secondary | ICD-10-CM | POA: Insufficient documentation
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
## 2011-06-15 MED ORDER — IBUPROFEN 800 MG PO TABS: 800.0000 mg | ORAL_TABLET | Freq: Three times a day (TID) | ORAL | Status: AC | PRN

## 2011-06-15 MED ORDER — IBUPROFEN 800 MG PO TABS
800.0000 mg | ORAL_TABLET | Freq: Once | ORAL | Status: AC
  Administered 2011-06-15: 800 mg via ORAL
  Filled 2011-06-15: qty 1

## 2011-06-15 MED ORDER — HYDROCODONE-ACETAMINOPHEN 5-325 MG PO TABS: 1.0000 | ORAL_TABLET | Freq: Four times a day (QID) | ORAL | Status: AC | PRN

## 2011-06-15 MED ORDER — CYCLOBENZAPRINE HCL 10 MG PO TABS: 10.0000 mg | ORAL_TABLET | Freq: Three times a day (TID) | ORAL | Status: AC | PRN
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
## 2011-06-15 NOTE — Discharge Instructions (Signed)
Return here as needed for any worsening in your condition.  Use ice and heat on her neck and back.  Follow up with her doctor for recheck. your x-rays were normal here tonight.

## 2011-06-15 NOTE — ED Provider Notes (Signed)
Medical screening examination/treatment/procedure(s) were performed by non-physician practitioner and as supervising physician I was immediately available for consultation/collaboration.  Doug Sou, MD 06/15/11 [phone]

## 2013-03-09 DIAGNOSIS — N809 Endometriosis, unspecified: Secondary | ICD-10-CM

## 2013-03-09 HISTORY — DX: Endometriosis, unspecified: N80.9

## 2013-08-23 ENCOUNTER — Other Ambulatory Visit: Payer: Self-pay | Admitting: Obstetrics and Gynecology

## 2013-09-06 HISTORY — PX: DIAGNOSTIC LAPAROSCOPY: SUR761

## 2013-09-25 ENCOUNTER — Encounter (HOSPITAL_COMMUNITY): Payer: Self-pay | Admitting: Pharmacy Technician

## 2013-09-26 ENCOUNTER — Other Ambulatory Visit (HOSPITAL_COMMUNITY): Payer: Self-pay | Admitting: Obstetrics and Gynecology

## 2013-09-26 ENCOUNTER — Encounter (HOSPITAL_COMMUNITY): Payer: Self-pay | Admitting: *Deleted

## 2013-09-26 NOTE — H&P (Signed)
Jeanne Waller is a 33 y.o.  female P 1-0-1-1 presents for diagnostic laparoscopy for chronic pelvic pain.  For several years the patient has experienced pelvic pain with and without her menses.  The pain is described as crampy and nagging pain that is occasionally relieved with Ibuprofen 800 mg and at other times not.  Menstrual flow lasts for 5 days with the heaviest 3 days requiring a pad change every 2 hours.  Occasionally she will experience blood clots.  On a weekly basis the patient reports this same pain that may last for hours or all day but is not as intense as it is with her menses.  She know of no  initiating or aggravating activity, has no inter-menstrual bleeding, no changes in bowel/bladder function and randomly will have non-positional dyspareunia.  She has been on oral contraceptives and the Mirena IUD in the past for her symptoms with no imnprovement.  A GC and chlamydia culture done in May 2015 was negative.  Pelvic ultrasound in November 2014 showed an anteverted uterus: 7.17 x 6.32 x 5.01 cm with a single anterior sub-serosal fibroid measuring-1.5 x 1.4 x 1.4 cm.  Patient's IUD in place at that time was in the proper orientation within the endometrial cavity. Right ovary-3.13 x 2.31 x 1.65 cm and left ovary-2.9 x 2.25 x 1.6 cm.  A review of both medical and surgical management options were given to the patient however,  she wants to proceed with surgical evaluation and possible management of her pelvic pain.  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 ALPRAZolam (XANAX) 1 MG tablet Take 1 mg by mouth 2 (two) times daily.  . carbamazepine (TEGRETOL) 200 MG tablet Take 800 mg by mouth at bedtime.   Marland Kitchen ibuprofen (ADVIL,MOTRIN) 200 MG tablet Take 800 mg by mouth every 6 (six) hours as needed for moderate pain.  Marland Kitchen lamoTRIgine (LAMICTAL) 200 MG tablet Take 400 mg by mouth at bedtime.   Marland Kitchen levonorgestrel-ethinyl estradiol (AVIANE,ALESSE,LESSINA) 0.1-20 MG-MCG tablet Take 1 tablet by mouth daily.    No Known Allergies  Denies sensitivity to peanuts, shellfish, soy, latex or adhesives.   ROS: Admits to corrective  lenses and right knee pain;  Denies headache, vision changes, nasal congestion, dysphagia, tinnitus, dizziness, hoarseness, cough,  chest pain, shortness of breath, nausea, vomiting, diarrhea,constipation,  urinary frequency, urgency  dysuria, hematuria, vaginitis symptoms, pelvic pain, swelling of joints,easy bruising,  myalgias, skin rashes, unexplained weight loss and except as is mentioned in the history of present illness, patient's review of systems is otherwise negative.   Physical Exam  Bp: 108/64  P: 80   R: 14   Temperature: 98.7 degrees F orally   Weight: 193 lbs.  Height: 5\' 8"   BMI: 29.3  Neck: supple without masses or thyromegaly Lungs: clear to auscultation Heart: regular rate and rhythm Abdomen:  soft, diffuse lower quadrant tenderness and no organomegaly Pelvic:EGBUS- wnl; vagina-normal rugae; uterus-normal size, cervix without lesions or motion tenderness; adnexae-mildly tender but  no  masses Extremities:  no clubbing, cyanosis or edema   Assesment: Chronic Pelvic Pain            Uterine Fibroid   Disposition:  A discussion was held with patient regarding the indication for her procedure(s) along with the  risks, which include but are not limited to: reaction to anesthesia, damage to adjacent organs, infection and excessive bleeding. The patient verbalized understanding of these risks and has consented to proceed with Diagnostic Laparoscopy at [place] on October 05, 2013.   CSN# [id number]   Rajanee Schuelke J. Florene Glen, PA-C  for Dr. Seymour Bars. [place].

## 2013-09-27 ENCOUNTER — Other Ambulatory Visit (HOSPITAL_COMMUNITY): Payer: Self-pay | Admitting: Obstetrics and Gynecology

## 2013-09-27 NOTE — H&P (Signed)
Jeanne Waller is a 33 y.o.  female P 1-0-1-1 presents for diagnostic laparoscopy for chronic pelvic pain.  For several years the patient has experienced pelvic pain with and without her menses.  The pain is described as crampy and nagging pain that is occasionally relieved with Ibuprofen 800 mg and at other times not.  Menstrual flow lasts for 5 days with the heaviest 3 days requiring a pad change every 2 hours.  Occasionally she will experience blood clots.  On a weekly basis the patient reports this same pain that may last for hours or all day but is not as intense as it is with her menses.  She know of no  initiating or aggravating activity, has no inter-menstrual bleeding, no changes in bowel/bladder function and randomly will have non-positional dyspareunia.  She has been on oral contraceptives and the Mirena IUD in the past for her symptoms with no imnprovement.  A GC and chlamydia culture done in May 2015 was negative.  Pelvic ultrasound in November 2014 showed an anteverted uterus: 7.17 x 6.32 x 5.01 cm with a single anterior sub-serosal fibroid measuring-1.5 x 1.4 x 1.4 cm.  Patient's IUD in place at that time was in the proper orientation within the endometrial cavity. Right ovary-3.13 x 2.31 x 1.65 cm and left ovary-2.9 x 2.25 x 1.6 cm.  A review of both medical and surgical management options were given to the patient however,  she wants to proceed with surgical evaluation and possible management of her pelvic pain.  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 ALPRAZolam (XANAX) 1 MG tablet Take 1 mg by mouth 2 (two) times daily.  . carbamazepine (TEGRETOL) 200 MG tablet Take 800 mg by mouth at bedtime.   Marland Kitchen ibuprofen (ADVIL,MOTRIN) 200 MG tablet Take 800 mg by mouth every 6 (six) hours as needed for moderate pain.  Marland Kitchen lamoTRIgine (LAMICTAL) 200 MG tablet Take 400 mg by mouth at bedtime.   Marland Kitchen levonorgestrel-ethinyl estradiol (AVIANE,ALESSE,LESSINA) 0.1-20 MG-MCG tablet Take 1 tablet by mouth daily.    No Known Allergies  Denies sensitivity to peanuts, shellfish, soy, latex or adhesives.   ROS: Admits to contact lenses and right knee pain;  Denies headache, vision changes, nasal congestion, dysphagia, tinnitus, dizziness, hoarseness, cough,  chest pain, shortness of breath, nausea, vomiting, diarrhea,constipation,  urinary frequency, urgency  dysuria, hematuria, vaginitis symptoms, pelvic pain, swelling of joints,easy bruising,  myalgias,  skin rashes, unexplained weight loss and except as is mentioned in the history of present illness, patient's review of systems is otherwise negative.  Physical Exam  Bp: 108/64  P: 80  R: 14  Temperature: 98.7 degrees F orally   Weight: 193 lbs.  Height: 5\' 8"    BMI: 29.3  Neck: supple without masses or thyromegaly Lungs: clear to auscultation Heart: regular rate and rhythm Abdomen: soft, diffuse  tenderness in both lower quadrants and no organomegaly Pelvic:EGBUS- wnl; vagina-normal rugae; uterus-normal size, cervix without lesions or motion tenderness; adnexae-mild tenderness bilaterally but no masses Extremities:  no clubbing, cyanosis or edema   Assesment: Chronic Pelvic Pain           Uterine Fibroid   Disposition:  A discussion was held with patient regarding the indication for her procedure(s) along  with the risks, which include but are not limited to: reaction to anesthesia, damage to adjacent organs, infection and excessive bleeding. The patient verbalized understanding of these risks and has consented to proceed with a Diagnostic Laparoscopy at [facility] on October 05, 2013.   CSN# [id number]   Jahsiah Carpenter J. Florene Glen, PA-C  for Dr. Seymour Bars. Haygood

## 2013-09-28 ENCOUNTER — Other Ambulatory Visit: Payer: Self-pay | Admitting: Obstetrics and Gynecology

## 2013-10-05 ENCOUNTER — Encounter (HOSPITAL_COMMUNITY): Payer: PRIVATE HEALTH INSURANCE | Admitting: Anesthesiology

## 2013-10-05 ENCOUNTER — Ambulatory Visit (HOSPITAL_COMMUNITY): Payer: PRIVATE HEALTH INSURANCE | Admitting: Anesthesiology

## 2013-10-05 ENCOUNTER — Ambulatory Visit (HOSPITAL_COMMUNITY)
Admission: RE | Admit: 2013-10-05 | Discharge: 2013-10-05 | Disposition: A | Discharge status: Home / Self Care | Payer: PRIVATE HEALTH INSURANCE | Source: Ambulatory Visit | Attending: Obstetrics and Gynecology | Admitting: Obstetrics and Gynecology

## 2013-10-05 ENCOUNTER — Encounter (HOSPITAL_COMMUNITY): Payer: Self-pay | Admitting: *Deleted

## 2013-10-05 ENCOUNTER — Encounter (HOSPITAL_COMMUNITY)
Admission: RE | Disposition: A | Discharge status: Home / Self Care | Payer: Self-pay | Source: Ambulatory Visit | Attending: Obstetrics and Gynecology

## 2013-10-05 DIAGNOSIS — F319 Bipolar disorder, unspecified: Secondary | ICD-10-CM | POA: Insufficient documentation

## 2013-10-05 DIAGNOSIS — K279 Peptic ulcer, site unspecified, unspecified as acute or chronic, without hemorrhage or perforation: Secondary | ICD-10-CM | POA: Insufficient documentation

## 2013-10-05 DIAGNOSIS — N803 Endometriosis of pelvic peritoneum, unspecified: Secondary | ICD-10-CM | POA: Insufficient documentation

## 2013-10-05 DIAGNOSIS — R102 Pelvic and perineal pain: Secondary | ICD-10-CM | POA: Diagnosis present

## 2013-10-05 DIAGNOSIS — N949 Unspecified condition associated with female genital organs and menstrual cycle: Secondary | ICD-10-CM | POA: Insufficient documentation

## 2013-10-05 DIAGNOSIS — D259 Leiomyoma of uterus, unspecified: Secondary | ICD-10-CM | POA: Diagnosis present

## 2013-10-05 DIAGNOSIS — Z87891 Personal history of nicotine dependence: Secondary | ICD-10-CM | POA: Insufficient documentation

## 2013-10-05 DIAGNOSIS — N809 Endometriosis, unspecified: Secondary | ICD-10-CM | POA: Diagnosis present

## 2013-10-05 DIAGNOSIS — D252 Subserosal leiomyoma of uterus: Secondary | ICD-10-CM | POA: Insufficient documentation

## 2013-10-05 HISTORY — PX: LAPAROSCOPIC LYSIS OF ADHESIONS: SHX5905

## 2013-10-05 HISTORY — PX: LAPAROSCOPY: SHX197

## 2013-10-05 LAB — CBC
HCT: 38.1 % (ref 36.0–46.0)
HEMOGLOBIN: 13 g/dL (ref 12.0–15.0)
MCH: 32.7 pg (ref 26.0–34.0)
MCHC: 34.1 g/dL (ref 30.0–36.0)
MCV: 95.7 fL (ref 78.0–100.0)
PLATELETS: 228 10*3/uL (ref 150–400)
RBC: 3.98 MIL/uL (ref 3.87–5.11)
RDW: 12.7 % (ref 11.5–15.5)
WBC: 5.8 10*3/uL (ref 4.0–10.5)

## 2013-10-05 LAB — PREGNANCY, URINE: Preg Test, Ur: NEGATIVE

## 2013-10-05 SURGERY — LAPAROSCOPY OPERATIVE
Anesthesia: General | Site: Abdomen

## 2013-10-05 MED ORDER — MIDAZOLAM HCL 2 MG/2ML IJ SOLN
INTRAMUSCULAR | Status: AC
  Filled 2013-10-05: qty 2

## 2013-10-05 MED ORDER — FENTANYL CITRATE 0.05 MG/ML IJ SOLN
INTRAMUSCULAR | Status: AC
  Administered 2013-10-05: 50 ug via INTRAVENOUS
  Filled 2013-10-05: qty 2

## 2013-10-05 MED ORDER — FENTANYL CITRATE 0.05 MG/ML IJ SOLN
25.0000 ug | INTRAMUSCULAR | Status: DC | PRN
  Administered 2013-10-05: 25 ug via INTRAVENOUS
  Administered 2013-10-05: 50 ug via INTRAVENOUS

## 2013-10-05 MED ORDER — ONDANSETRON HCL 4 MG/2ML IJ SOLN
INTRAMUSCULAR | Status: DC | PRN
  Administered 2013-10-05: 4 mg via INTRAVENOUS

## 2013-10-05 MED ORDER — NEOSTIGMINE METHYLSULFATE 10 MG/10ML IV SOLN
INTRAVENOUS | Status: DC | PRN
  Administered 2013-10-05: 3 mg via INTRAVENOUS

## 2013-10-05 MED ORDER — MIDAZOLAM HCL 5 MG/5ML IJ SOLN
INTRAMUSCULAR | Status: DC | PRN
  Administered 2013-10-05: 2 mg via INTRAVENOUS

## 2013-10-05 MED ORDER — KETOROLAC TROMETHAMINE 30 MG/ML IJ SOLN
INTRAMUSCULAR | Status: DC | PRN
  Administered 2013-10-05: 30 mg via INTRAVENOUS
  Administered 2013-10-05: 30 mg via INTRAMUSCULAR

## 2013-10-05 MED ORDER — FENTANYL CITRATE 0.05 MG/ML IJ SOLN
INTRAMUSCULAR | Status: AC
  Filled 2013-10-05: qty 5

## 2013-10-05 MED ORDER — KETOROLAC TROMETHAMINE 30 MG/ML IJ SOLN
INTRAMUSCULAR | Status: AC
  Filled 2013-10-05: qty 2

## 2013-10-05 MED ORDER — GLYCOPYRROLATE 0.2 MG/ML IJ SOLN
INTRAMUSCULAR | Status: AC
  Filled 2013-10-05: qty 2

## 2013-10-05 MED ORDER — HYDROCODONE-ACETAMINOPHEN 5-325 MG PO TABS: 1.0000 | ORAL_TABLET | Freq: Four times a day (QID) | ORAL | Status: DC | PRN

## 2013-10-05 MED ORDER — MEPERIDINE HCL 25 MG/ML IJ SOLN: 6.2500 mg | INTRAMUSCULAR | Status: DC | PRN

## 2013-10-05 MED ORDER — SCOPOLAMINE 1 MG/3DAYS TD PT72
1.0000 | MEDICATED_PATCH | Freq: Once | TRANSDERMAL | Status: DC
  Administered 2013-10-05: 1.5 mg via TRANSDERMAL

## 2013-10-05 MED ORDER — GLYCOPYRROLATE 0.2 MG/ML IJ SOLN
INTRAMUSCULAR | Status: DC | PRN
  Administered 2013-10-05: .4 mg via INTRAVENOUS

## 2013-10-05 MED ORDER — HYDROCODONE-ACETAMINOPHEN 5-325 MG PO TABS
ORAL_TABLET | ORAL | Status: AC
  Administered 2013-10-05: 1 via ORAL
  Filled 2013-10-05: qty 1

## 2013-10-05 MED ORDER — LIDOCAINE HCL (CARDIAC) 20 MG/ML IV SOLN
INTRAVENOUS | Status: DC | PRN
  Administered 2013-10-05: 50 mg via INTRAVENOUS

## 2013-10-05 MED ORDER — FENTANYL CITRATE 0.05 MG/ML IJ SOLN
INTRAMUSCULAR | Status: DC | PRN
  Administered 2013-10-05: 100 ug via INTRAVENOUS
  Administered 2013-10-05: 50 ug via INTRAVENOUS
  Administered 2013-10-05: 100 ug via INTRAVENOUS

## 2013-10-05 MED ORDER — SCOPOLAMINE 1 MG/3DAYS TD PT72
MEDICATED_PATCH | TRANSDERMAL | Status: AC
  Administered 2013-10-05: 1.5 mg via TRANSDERMAL
  Filled 2013-10-05: qty 1

## 2013-10-05 MED ORDER — KETOROLAC TROMETHAMINE 30 MG/ML IJ SOLN: 15.0000 mg | Freq: Once | INTRAMUSCULAR | Status: DC | PRN

## 2013-10-05 MED ORDER — PROPOFOL 10 MG/ML IV EMUL
INTRAVENOUS | Status: AC
  Filled 2013-10-05: qty 20

## 2013-10-05 MED ORDER — DEXAMETHASONE SODIUM PHOSPHATE 10 MG/ML IJ SOLN
INTRAMUSCULAR | Status: AC
  Filled 2013-10-05: qty 1

## 2013-10-05 MED ORDER — HYDROCODONE-ACETAMINOPHEN 5-325 MG PO TABS
1.0000 | ORAL_TABLET | Freq: Once | ORAL | Status: AC
  Administered 2013-10-05: 1 via ORAL

## 2013-10-05 MED ORDER — ONDANSETRON HCL 4 MG/2ML IJ SOLN
INTRAMUSCULAR | Status: AC
  Filled 2013-10-05: qty 2

## 2013-10-05 MED ORDER — BUPIVACAINE HCL (PF) 0.25 % IJ SOLN
INTRAMUSCULAR | Status: AC
  Filled 2013-10-05: qty 30

## 2013-10-05 MED ORDER — ROCURONIUM BROMIDE 100 MG/10ML IV SOLN
INTRAVENOUS | Status: DC | PRN
  Administered 2013-10-05: 50 mg via INTRAVENOUS

## 2013-10-05 MED ORDER — ROCURONIUM BROMIDE 100 MG/10ML IV SOLN
INTRAVENOUS | Status: AC
  Filled 2013-10-05: qty 1

## 2013-10-05 MED ORDER — IBUPROFEN 600 MG PO TABS: ORAL_TABLET | ORAL | Status: DC

## 2013-10-05 MED ORDER — PROPOFOL 10 MG/ML IV BOLUS
INTRAVENOUS | Status: DC | PRN
  Administered 2013-10-05: 200 mg via INTRAVENOUS

## 2013-10-05 MED ORDER — NEOSTIGMINE METHYLSULFATE 10 MG/10ML IV SOLN
INTRAVENOUS | Status: AC
  Filled 2013-10-05: qty 1

## 2013-10-05 MED ORDER — LIDOCAINE HCL (CARDIAC) 20 MG/ML IV SOLN
INTRAVENOUS | Status: AC
  Filled 2013-10-05: qty 5

## 2013-10-05 MED ORDER — BUPIVACAINE HCL (PF) 0.25 % IJ SOLN
INTRAMUSCULAR | Status: DC | PRN
  Administered 2013-10-05: 30 mL

## 2013-10-05 MED ORDER — DEXAMETHASONE SODIUM PHOSPHATE 4 MG/ML IJ SOLN
INTRAMUSCULAR | Status: DC | PRN
  Administered 2013-10-05: 4 mg via INTRAVENOUS

## 2013-10-05 MED ORDER — LACTATED RINGERS IV SOLN
INTRAVENOUS | Status: DC
  Administered 2013-10-05 (×2): via INTRAVENOUS

## 2013-10-05 MED ORDER — 0.9 % SODIUM CHLORIDE (POUR BTL) OPTIME
TOPICAL | Status: DC | PRN
  Administered 2013-10-05: 1000 mL

## 2013-10-05 MED ORDER — METOCLOPRAMIDE HCL 5 MG/ML IJ SOLN: 10.0000 mg | Freq: Once | INTRAMUSCULAR | Status: DC | PRN

## 2013-10-05 SURGICAL SUPPLY — 34 items
ADH SKN CLS APL DERMABOND .7 (GAUZE/BANDAGES/DRESSINGS)
BAG SPEC RTRVL LRG 6X4 10 (ENDOMECHANICALS)
BLADE SURG 11 STRL SS (BLADE) ×2 IMPLANT
CABLE HIGH FREQUENCY MONO STRZ (ELECTRODE) IMPLANT
CHLORAPREP W/TINT 26ML (MISCELLANEOUS) ×2 IMPLANT
CLOTH BEACON ORANGE TIMEOUT ST (SAFETY) ×2 IMPLANT
CONTAINER PREFILL 10% NBF 60ML (FORM) IMPLANT
DERMABOND ADVANCED (GAUZE/BANDAGES/DRESSINGS)
DERMABOND ADVANCED .7 DNX12 (GAUZE/BANDAGES/DRESSINGS) IMPLANT
DRSG COVADERM PLUS 2X2 (GAUZE/BANDAGES/DRESSINGS) ×4 IMPLANT
DRSG OPSITE POSTOP 3X4 (GAUZE/BANDAGES/DRESSINGS) IMPLANT
DRSG TELFA 3X8 NADH (GAUZE/BANDAGES/DRESSINGS) IMPLANT
GLOVE SURG SS PI 6.5 STRL IVOR (GLOVE) ×4 IMPLANT
GOWN STRL REUS W/TWL LRG LVL3 (GOWN DISPOSABLE) ×4 IMPLANT
NS IRRIG 1000ML POUR BTL (IV SOLUTION) ×2 IMPLANT
PACK LAPAROSCOPY BASIN (CUSTOM PROCEDURE TRAY) ×2 IMPLANT
PAD DRESSING TELFA 3X8 NADH (GAUZE/BANDAGES/DRESSINGS) IMPLANT
POUCH SPECIMEN RETRIEVAL 10MM (ENDOMECHANICALS) IMPLANT
PROTECTOR NERVE ULNAR (MISCELLANEOUS) ×2 IMPLANT
SET IRRIG TUBING LAPAROSCOPIC (IRRIGATION / IRRIGATOR) IMPLANT
SHEARS HARMONIC ACE PLUS 36CM (ENDOMECHANICALS) IMPLANT
STRIP CLOSURE SKIN 1/4X3 (GAUZE/BANDAGES/DRESSINGS) IMPLANT
SUT MNCRL AB 4-0 PS2 18 (SUTURE) IMPLANT
SUT VIC AB 3-0 PS2 18 (SUTURE)
SUT VIC AB 3-0 PS2 18XBRD (SUTURE) IMPLANT
SUT VICRYL 0 ENDOLOOP (SUTURE) IMPLANT
SUT VICRYL 0 UR6 27IN ABS (SUTURE) ×2 IMPLANT
SYRINGE 60CC LL (MISCELLANEOUS) ×2 IMPLANT
TOWEL OR 17X24 6PK STRL BLUE (TOWEL DISPOSABLE) ×4 IMPLANT
TRAY FOLEY CATH 14FR (SET/KITS/TRAYS/PACK) ×2 IMPLANT
TROCAR BALL TOP DISP 5MM (ENDOMECHANICALS) IMPLANT
TROCAR XCEL DIL TIP R 11M (ENDOMECHANICALS) IMPLANT
WARMER LAPAROSCOPE (MISCELLANEOUS) ×2 IMPLANT
WATER STERILE IRR 1000ML POUR (IV SOLUTION) ×2 IMPLANT

## 2013-10-05 NOTE — Op Note (Signed)
Diagnostic Laparoscopy Procedure Note  Indications: The patient is a 33 y.o. female with Pelvic pain  Pre-operative Diagnosis: Pelvic pain.  Small uterine fibroid  Post-operative Diagnosis: Pelvic pain.  Small uterine fibroid.  Endometriosis.  Pelvic Adhesions  Surgeon: Kendall Flack P   Assistants: None  Anesthesia: General endotracheal anesthesia  ASA Class: 2  Procedure Details  The patient was seen in the Holding Room. The risks, benefits, complications, treatment options, and expected outcomes were discussed with the patient. The possibilities of reaction to medication, pulmonary aspiration, perforation of viscus, bleeding, recurrent infection, the need for additional procedures, failure to diagnose a condition, and creating a complication requiring transfusion or operation were discussed with the patient. The patient concurred with the proposed plan, giving informed consent. The patient was taken to the Operating Room, identified as Jeanne Waller and the procedure verified as Diagnostic Laparoscopy. A Time Out was held and the above information confirmed.  After induction of general anesthesia, the patient was placed in modified dorsal lithotomy position where she was prepped, draped, and catheterized in the normal, sterile fashion.  The cervix was visualized and an intrauterine manipulator was placed. A 1 cm umbilical incision was then performed. Blunt and sharp dissection allowed access to the fascia, which was incised. The peritoneum was entered bluntly and the [facility] cannula placed through the incision into the peritoneal cavity. Under direct visualization. Upon identification of the fascia 2 sutures of 0 Vicryl were placed and the sutures were then wrapped around the Hassan cannula to form an airtight seal.  A pneumoperitoneum was created and the laparoscope placed through the trocar sleeve. A suprapubic incision to the left of midline was placed and a 10 mm trocar placed through  that incision into the peritoneal cavity. Under direct visualization. The above findings were noted. The right round ligament. Peritoneal window defect was then biopsy excised at its base and removed from the operative field. Hemostasis was noted to be adequate. Lysis of adhesions in the area of the left tube and ovary with adhesions between the sigmoid colon and the left pelvic sidewall were lysed. Copious irrigation was carried out and hemostasis noted to be adequate. Approximately 30 cc of saline was left in the pelvis.  Following the procedure the left lower quadrant trocar on removed under direct visualization as the  intra-abdominal carbon dioxide was expressed. The Hassan canula was removed and the fascia closed with the holding sutures in figure of eight fashion. The incision was closed with subcutaneous and subcuticular sutures of 4-0 Vicryl. The intrauterine manipulator was then removed.  Instrument, sponge, and needle counts were correct prior to abdominal closure and at the conclusion of the case.   Findings: The anterior cul-de-sac and round ligaments:  No anterior cul de sac lesions.  Right round ligament contains a peritoneal window lesion The uterus contained a single visible 2 sm anterior fundal fibroid that was subserosal The adnexa No masses.  Ovaries nl bilaterally.  Left infundibulopelvic ligament contained a 87mm hemorrhagic cystic lesion Cul-de-sac:  No definite lesions.  Left side of uterosacral ligaments contained more numerous tiney caliber vessels than the right.  Estimated Blood Loss:  less than 50 mL         Drains: none         Specimens: Excisional bx of right round ligament            Complications:  None; patient tolerated the procedure well.         Disposition: PACU - hemodynamically  stable.         Condition: stable

## 2013-10-05 NOTE — Transfer of Care (Signed)
Immediate Anesthesia Transfer of Care Note  Patient: Jeanne Waller  Procedure(s) Performed: Procedure(s) with comments: LAPAROSCOPY OPERATIVE (N/A) - Please have Harmonic scalpel available LAPAROSCOPIC LYSIS OF ADHESIONS (N/A)  Patient Location: PACU  Anesthesia Type:General  Level of Consciousness: awake, alert  and oriented  Airway & Oxygen Therapy: Patient Spontanous Breathing and Patient connected to nasal cannula oxygen  Post-op Assessment: Report given to PACU RN and Post -op Vital signs reviewed and stable  Post vital signs: Reviewed and stable  Complications: No apparent anesthesia complications

## 2013-10-05 NOTE — H&P (Signed)
  History and Physical Interval Note:   10/05/2013   12:38 PM   Jeanne Waller  has presented today for surgery, with the diagnosis of Pelvic Pain and uterine fibroid. The various methods of treatment have been discussed with the patient and family. After consideration of risks, benefits and other options for treatment, the patient has consented to  Procedure(s): LAPAROSCOPY OPERATIVE with possible  Removal of fibroid tumor as a surgical intervention .  I have reviewed the patients' chart and labs.  Questions were answered to the patient's satisfaction.     Eldred Manges  MD

## 2013-10-05 NOTE — Anesthesia Postprocedure Evaluation (Signed)
  Anesthesia Post Note  Patient: Jeanne Waller  Procedure(s) Performed: Procedure(s) (LRB): LAPAROSCOPY OPERATIVE (N/A) LAPAROSCOPIC LYSIS OF ADHESIONS (N/A)  Anesthesia type: GA  Patient location: PACU  Post pain: Pain level controlled  Post assessment: Post-op Vital signs reviewed  Last Vitals:  Filed Vitals:   10/05/13 1645  BP: 120/81  Pulse: 65  Temp:   Resp: 14    Post vital signs: Reviewed  Level of consciousness: sedated  Complications: No apparent anesthesia complications

## 2013-10-05 NOTE — Anesthesia Preprocedure Evaluation (Addendum)
Anesthesia Evaluation  Patient identified by MRN, date of birth, ID band Patient awake    Reviewed: Allergy & Precautions, H&P , NPO status , Patient's Chart, lab work & pertinent test results, reviewed documented beta blocker date and time   History of Anesthesia Complications Negative for: history of anesthetic complications  Airway Mallampati: II TM Distance: >3 FB Neck ROM: full    Dental  (+) Teeth Intact   Pulmonary neg pulmonary ROS, former smoker,  breath sounds clear to auscultation  Pulmonary exam normal       Cardiovascular Exercise Tolerance: Good negative cardio ROS  Rhythm:regular Rate:Normal     Neuro/Psych Bipolar Disorder negative neurological ROS     GI/Hepatic Neg liver ROS, PUD,   Endo/Other  negative endocrine ROS  Renal/GU negative Renal ROS  Female GU complaint     Musculoskeletal   Abdominal   Peds  Hematology negative hematology ROS (+)   Anesthesia Other Findings   Reproductive/Obstetrics negative OB ROS                          Anesthesia Physical Anesthesia Plan  ASA: II  Anesthesia Plan: General ETT   Post-op Pain Management:    Induction:   Airway Management Planned:   Additional Equipment:   Intra-op Plan:   Post-operative Plan:   Informed Consent: I have reviewed the patients History and Physical, chart, labs and discussed the procedure including the risks, benefits and alternatives for the proposed anesthesia with the patient or authorized representative who has indicated his/her understanding and acceptance.   Dental Advisory Given  Plan Discussed with: CRNA and Surgeon  Anesthesia Plan Comments:         Anesthesia Quick Evaluation

## 2013-10-05 NOTE — Discharge Instructions (Signed)

## 2013-10-06 ENCOUNTER — Encounter (HOSPITAL_COMMUNITY): Payer: Self-pay | Admitting: Obstetrics and Gynecology

## 2013-10-14 ENCOUNTER — Encounter: Payer: Self-pay | Admitting: Family Medicine

## 2014-01-12 LAB — HM PAP SMEAR: HM PAP: NORMAL

## 2014-01-21 ENCOUNTER — Encounter: Payer: Self-pay | Admitting: Family Medicine

## 2014-03-09 NOTE — L&D Delivery Note (Addendum)
Final Labor Progress Note At 2305 pt reports an increased in rectal pressure.  VE C/C/0.  FHR remained reassuring with early decelerations and occasional variable  decelerations.  Vaginal Delivery Note The pt utilized an epidural as pain management.   Spontaneous rupture of membranes at 0513 02/16/15, clear.  GBS was positive, PCN x 5 doses were given.  Cervical dilation was complete at  2305.  NICHD Category 1.    Pushing with guidance began at  2305.   After 1 hour(s) and  18 minutes of pushing the head, shoulders and the body of a viable female infant "Jeanne Waller" delivered spontaneously with maternal effort in the ROA position at 0023    Shoulder dystocia relieved with gentle outward traction, maternal effort, the head of the bed lowered and McRoberts.   Loose [place] x 1,  infant somersaulted thru without difficulty.   Infant with flaccid tone, no grimace, dusky color.  The cord was clamped, cut and the infant was immediately handed to nursing team/ rapid response nurse to be taken to the warmer for immediate assessment.After 1 minute, code Agpar called and NICU team arrived including Dr. Clifton James.  Cord clamped and cut again for cord gas and blood collection.    Spontaneous delivery of a intact placenta with a 3 vessel cord via Shultz/Duncan at 0033.   Episiotomy: None  The vulva, perineum, vaginal vault, rectum and cervix were inspected and revealed a 2nd degree perineal, repaired using a 3-0 vicryl on a CT needle and a superficial bilateral labial laceration which was repaired using a 4-0 vicryl on a SH  Needle. Lidocaine was not used, the epidural was sufficient for the repair.   The rectum sphincter intact after the repair.  Patient tolerated repair well.   Postpartum pitocin as ordered.  Fundus firm, lochia minimum, bleeding under control.   EBL 250, Pt hemodynamically stable.   Sponge, laps and needle count correct and verified with the primary care nurse.  Attending MD available at  all times.    Routine postpartum orders   Mother desires Postpartum bilateral tubal ligation for contraception  Mom plans to breastfeed.   Placenta to pathology: NO     Cord Gases sent to lab: Yes (arterial only) Cord blood sent to lab: YES   APGARS:  2 at 1 minute and 9 at 5 minutes Weight:.   8lb 4oz   Both mom and baby were left in stable condition, baby skin to skin.     Jeanne Waller, [name], MSN 02/17/2015. 1:28 AM

## 2014-05-14 ENCOUNTER — Encounter: Payer: Self-pay | Admitting: Family Medicine

## 2014-07-16 LAB — OB RESULTS CONSOLE HIV ANTIBODY (ROUTINE TESTING): HIV: NONREACTIVE

## 2014-07-16 LAB — OB RESULTS CONSOLE RUBELLA ANTIBODY, IGM: RUBELLA: NON-IMMUNE/NOT IMMUNE

## 2014-07-16 LAB — OB RESULTS CONSOLE GC/CHLAMYDIA
Chlamydia: NEGATIVE
Gonorrhea: NEGATIVE

## 2014-07-16 LAB — OB RESULTS CONSOLE ABO/RH: RH Type: POSITIVE

## 2014-07-16 LAB — OB RESULTS CONSOLE HEPATITIS B SURFACE ANTIGEN: HEP B S AG: NEGATIVE

## 2014-07-16 LAB — OB RESULTS CONSOLE RPR: RPR: NONREACTIVE

## 2014-07-16 LAB — OB RESULTS CONSOLE ANTIBODY SCREEN: Antibody Screen: NEGATIVE

## 2015-01-29 LAB — OB RESULTS CONSOLE GBS: GBS: POSITIVE

## 2015-01-29 LAB — OB RESULTS CONSOLE GC/CHLAMYDIA
Chlamydia: NEGATIVE
GC PROBE AMP, GENITAL: NEGATIVE

## 2015-02-16 ENCOUNTER — Inpatient Hospital Stay (HOSPITAL_COMMUNITY): Payer: PRIVATE HEALTH INSURANCE | Admitting: Anesthesiology

## 2015-02-16 ENCOUNTER — Inpatient Hospital Stay (HOSPITAL_COMMUNITY): Payer: PRIVATE HEALTH INSURANCE

## 2015-02-16 ENCOUNTER — Inpatient Hospital Stay (HOSPITAL_COMMUNITY)
Admission: AD | Admit: 2015-02-16 | Discharge: 2015-02-19 | DRG: 767 | Disposition: A | Discharge status: Home / Self Care | Payer: PRIVATE HEALTH INSURANCE | Source: Ambulatory Visit | Attending: Obstetrics and Gynecology | Admitting: Obstetrics and Gynecology

## 2015-02-16 ENCOUNTER — Encounter (HOSPITAL_COMMUNITY): Payer: Self-pay | Admitting: *Deleted

## 2015-02-16 DIAGNOSIS — O9902 Anemia complicating childbirth: Secondary | ICD-10-CM | POA: Diagnosis present

## 2015-02-16 DIAGNOSIS — Z9851 Tubal ligation status: Secondary | ICD-10-CM

## 2015-02-16 DIAGNOSIS — O3413 Maternal care for benign tumor of corpus uteri, third trimester: Secondary | ICD-10-CM | POA: Diagnosis present

## 2015-02-16 DIAGNOSIS — O99824 Streptococcus B carrier state complicating childbirth: Secondary | ICD-10-CM | POA: Diagnosis present

## 2015-02-16 DIAGNOSIS — D649 Anemia, unspecified: Secondary | ICD-10-CM | POA: Diagnosis present

## 2015-02-16 DIAGNOSIS — F319 Bipolar disorder, unspecified: Secondary | ICD-10-CM | POA: Diagnosis present

## 2015-02-16 DIAGNOSIS — O9962 Diseases of the digestive system complicating childbirth: Secondary | ICD-10-CM | POA: Diagnosis present

## 2015-02-16 DIAGNOSIS — O09529 Supervision of elderly multigravida, unspecified trimester: Secondary | ICD-10-CM

## 2015-02-16 DIAGNOSIS — Z8249 Family history of ischemic heart disease and other diseases of the circulatory system: Secondary | ICD-10-CM | POA: Diagnosis not present

## 2015-02-16 DIAGNOSIS — Z833 Family history of diabetes mellitus: Secondary | ICD-10-CM | POA: Diagnosis not present

## 2015-02-16 DIAGNOSIS — Z87891 Personal history of nicotine dependence: Secondary | ICD-10-CM

## 2015-02-16 DIAGNOSIS — D259 Leiomyoma of uterus, unspecified: Secondary | ICD-10-CM | POA: Diagnosis present

## 2015-02-16 DIAGNOSIS — O99344 Other mental disorders complicating childbirth: Secondary | ICD-10-CM | POA: Diagnosis present

## 2015-02-16 DIAGNOSIS — K219 Gastro-esophageal reflux disease without esophagitis: Secondary | ICD-10-CM | POA: Diagnosis present

## 2015-02-16 DIAGNOSIS — O9989 Other specified diseases and conditions complicating pregnancy, childbirth and the puerperium: Secondary | ICD-10-CM

## 2015-02-16 DIAGNOSIS — Z302 Encounter for sterilization: Secondary | ICD-10-CM

## 2015-02-16 DIAGNOSIS — O99214 Obesity complicating childbirth: Secondary | ICD-10-CM | POA: Diagnosis present

## 2015-02-16 DIAGNOSIS — Z2839 Other underimmunization status: Secondary | ICD-10-CM

## 2015-02-16 DIAGNOSIS — O4292 Full-term premature rupture of membranes, unspecified as to length of time between rupture and onset of labor: Principal | ICD-10-CM | POA: Diagnosis present

## 2015-02-16 DIAGNOSIS — N809 Endometriosis, unspecified: Secondary | ICD-10-CM | POA: Diagnosis present

## 2015-02-16 DIAGNOSIS — O9081 Anemia of the puerperium: Secondary | ICD-10-CM | POA: Diagnosis present

## 2015-02-16 DIAGNOSIS — Z3A38 38 weeks gestation of pregnancy: Secondary | ICD-10-CM

## 2015-02-16 DIAGNOSIS — Z6841 Body Mass Index (BMI) 40.0 and over, adult: Secondary | ICD-10-CM | POA: Diagnosis not present

## 2015-02-16 DIAGNOSIS — O4290 Premature rupture of membranes, unspecified as to length of time between rupture and onset of labor, unspecified weeks of gestation: Secondary | ICD-10-CM

## 2015-02-16 DIAGNOSIS — B951 Streptococcus, group B, as the cause of diseases classified elsewhere: Secondary | ICD-10-CM | POA: Diagnosis present

## 2015-02-16 DIAGNOSIS — Z283 Underimmunization status: Secondary | ICD-10-CM

## 2015-02-16 DIAGNOSIS — O09899 Supervision of other high risk pregnancies, unspecified trimester: Secondary | ICD-10-CM

## 2015-02-16 HISTORY — DX: Bipolar disorder, unspecified: F31.9

## 2015-02-16 LAB — CBC
HCT: 36 % (ref 36.0–46.0)
Hemoglobin: 12 g/dL (ref 12.0–15.0)
MCH: 31.7 pg (ref 26.0–34.0)
MCHC: 33.3 g/dL (ref 30.0–36.0)
MCV: 95 fL (ref 78.0–100.0)
PLATELETS: 170 10*3/uL (ref 150–400)
RBC: 3.79 MIL/uL — AB (ref 3.87–5.11)
RDW: 14.4 % (ref 11.5–15.5)
WBC: 8.1 10*3/uL (ref 4.0–10.5)

## 2015-02-16 LAB — TYPE AND SCREEN
ABO/RH(D): O POS
Antibody Screen: NEGATIVE

## 2015-02-16 LAB — RPR: RPR: NONREACTIVE

## 2015-02-16 MED ORDER — FLEET ENEMA 7-19 GM/118ML RE ENEM: 1.0000 | ENEMA | RECTAL | Status: DC | PRN

## 2015-02-16 MED ORDER — LACTATED RINGERS IV SOLN
INTRAVENOUS | Status: DC
  Administered 2015-02-16 (×4): via INTRAVENOUS

## 2015-02-16 MED ORDER — MISOPROSTOL 200 MCG PO TABS: 50.0000 ug | ORAL_TABLET | ORAL | Status: DC | PRN

## 2015-02-16 MED ORDER — ONDANSETRON HCL 4 MG/2ML IJ SOLN: 4.0000 mg | Freq: Four times a day (QID) | INTRAMUSCULAR | Status: DC | PRN

## 2015-02-16 MED ORDER — OXYTOCIN 40 UNITS IN LACTATED RINGERS INFUSION - SIMPLE MED
62.5000 mL/h | INTRAVENOUS | Status: DC
  Administered 2015-02-17: 62.5 mL/h via INTRAVENOUS

## 2015-02-16 MED ORDER — OXYCODONE-ACETAMINOPHEN 5-325 MG PO TABS: 1.0000 | ORAL_TABLET | ORAL | Status: DC | PRN

## 2015-02-16 MED ORDER — LACTATED RINGERS IV SOLN
INTRAVENOUS | Status: DC
Start: 2015-02-16 — End: 2015-02-17
  Administered 2015-02-16: 18:00:00 via INTRAUTERINE

## 2015-02-16 MED ORDER — TERBUTALINE SULFATE 1 MG/ML IJ SOLN
0.2500 mg | Freq: Once | INTRAMUSCULAR | Status: DC | PRN
  Filled 2015-02-16: qty 1

## 2015-02-16 MED ORDER — OXYTOCIN BOLUS FROM INFUSION
500.0000 mL | INTRAVENOUS | Status: DC
  Administered 2015-02-17 (×2): 500 mL via INTRAVENOUS

## 2015-02-16 MED ORDER — PHENYLEPHRINE 40 MCG/ML (10ML) SYRINGE FOR IV PUSH (FOR BLOOD PRESSURE SUPPORT)
80.0000 ug | PREFILLED_SYRINGE | INTRAVENOUS | Status: DC | PRN
  Filled 2015-02-16: qty 2
  Filled 2015-02-16: qty 20

## 2015-02-16 MED ORDER — FENTANYL 2.5 MCG/ML BUPIVACAINE 1/10 % EPIDURAL INFUSION (WH - ANES)
14.0000 mL/h | INTRAMUSCULAR | Status: DC | PRN
  Administered 2015-02-16: 14 mL/h via EPIDURAL
  Filled 2015-02-16: qty 125

## 2015-02-16 MED ORDER — LACTATED RINGERS IV SOLN
500.0000 mL | INTRAVENOUS | Status: DC | PRN
  Administered 2015-02-16 (×4): 500 mL via INTRAVENOUS

## 2015-02-16 MED ORDER — DIPHENHYDRAMINE HCL 50 MG/ML IJ SOLN: 12.5000 mg | INTRAMUSCULAR | Status: DC | PRN

## 2015-02-16 MED ORDER — LIDOCAINE HCL (PF) 1 % IJ SOLN
INTRAMUSCULAR | Status: DC | PRN
  Administered 2015-02-16 (×2): 4 mL via EPIDURAL

## 2015-02-16 MED ORDER — OXYTOCIN 40 UNITS IN LACTATED RINGERS INFUSION - SIMPLE MED
62.5000 mL/h | INTRAVENOUS | Status: DC
  Filled 2015-02-16: qty 1000

## 2015-02-16 MED ORDER — PENICILLIN G POTASSIUM 5000000 UNITS IJ SOLR
5.0000 10*6.[IU] | Freq: Once | INTRAVENOUS | Status: AC
  Administered 2015-02-16: 5 10*6.[IU] via INTRAVENOUS
  Filled 2015-02-16: qty 5

## 2015-02-16 MED ORDER — OXYTOCIN 40 UNITS IN LACTATED RINGERS INFUSION - SIMPLE MED
1.0000 m[IU]/min | INTRAVENOUS | Status: DC
  Administered 2015-02-16: 1 m[IU]/min via INTRAVENOUS

## 2015-02-16 MED ORDER — OXYCODONE-ACETAMINOPHEN 5-325 MG PO TABS: 2.0000 | ORAL_TABLET | ORAL | Status: DC | PRN

## 2015-02-16 MED ORDER — FENTANYL CITRATE (PF) 100 MCG/2ML IJ SOLN
50.0000 ug | INTRAMUSCULAR | Status: DC | PRN
  Administered 2015-02-16 (×2): 100 ug via INTRAVENOUS
  Filled 2015-02-16 (×2): qty 2

## 2015-02-16 MED ORDER — CITRIC ACID-SODIUM CITRATE 334-500 MG/5ML PO SOLN: 30.0000 mL | ORAL | Status: DC | PRN

## 2015-02-16 MED ORDER — ACETAMINOPHEN 325 MG PO TABS: 650.0000 mg | ORAL_TABLET | ORAL | Status: DC | PRN

## 2015-02-16 MED ORDER — PENICILLIN G POTASSIUM 5000000 UNITS IJ SOLR
2.5000 10*6.[IU] | INTRAVENOUS | Status: DC
  Administered 2015-02-16 (×4): 2.5 10*6.[IU] via INTRAVENOUS
  Filled 2015-02-16 (×8): qty 2.5

## 2015-02-16 MED ORDER — EPHEDRINE 5 MG/ML INJ
10.0000 mg | INTRAVENOUS | Status: DC | PRN
  Filled 2015-02-16: qty 2

## 2015-02-16 MED ORDER — LIDOCAINE HCL (PF) 1 % IJ SOLN
30.0000 mL | INTRAMUSCULAR | Status: DC | PRN
  Filled 2015-02-16: qty 30

## 2015-02-16 NOTE — Progress Notes (Signed)
Jeanne Waller MRN: [id number]  Subjective: -Patient reports perception of contractions.  States she would like to proceed with augmentation.    Objective: BP 124/74 mmHg  Pulse 98  Temp(Src) 98.6 F (37 C) (Axillary)  Resp 18  Ht 5\' 8"  (1.727 m)  Wt 121.11 kg (267 lb)  BMI 40.61 kg/m2 I/O last 3 completed shifts: In: 500 [I.V.:500] Out: -     Fetal Monitoring: FHT: 145 bpm, Mod Var, -Decels, +Accels UC: palpates moderate    Vaginal Exam: SVE:   Dilation: 3.5 Effacement (%): 70 Station: Ballotable Exam by:: [name], [name] Membranes:SROM x 17hrs Internal Monitors: IUPC inserted-Amnioinfusion initiated  Augmentation/Induction: Pitocin:Initiated Cytotec: None   Assessment:  IUP at 38.4wks Cat I FT  Pitocin Augmentation MSAF  Plan: -Discussed IUPC r/b, prior to insertion, including increased risk of infection and ability to adequately monitor quantity and strength of contractions.  Patient agrees to placement -Start pitocin at 44mUn/min  -Start amnioinfusion at 250/125 -Continue other mgmt as ordered -Dr. [name] updated on patient status  Riley Churches, [name] 02/16/2015, 5:57 PM

## 2015-02-16 NOTE — Progress Notes (Signed)
Jeanne Waller MRN: [id number]  Subjective: -Care assumed of 34y.o. G3P1011 at 38.4wks who presents for PPROM at 0130.  In room to meet acquaintance and discuss POC. Patient resting in bed and reports some discomfort in lower abdominal area.  Nurse at bedside to administer IV pain relief.    Objective: BP 122/75 mmHg  Pulse 92  Temp(Src) 98.5 F (36.9 C) (Oral)  Resp 18  Ht 5\' 8"  (1.727 m)  Wt 121.11 kg (267 lb)  BMI 40.61 kg/m2 I/O last 3 completed shifts: In: 500 [I.V.:500] Out: -     Fetal Monitoring: FHT: 155 bpm, Mod Var, -Decels, +Accels UC: Appears inverted on Toco, Q3-16min, palpates mild to moderate    Vaginal Exam: SVE:   Dilation: 1 Effacement (%): 70 Station: -2 Exam by:: R. Stall, [name] at [place] at 0130 Internal Monitors: None  Augmentation/Induction: Pitocin:None Cytotec: None  Assessment:  IUP at 38.4wks Cat I FT  SROM Expectant Mgmt  Plan: -In room to discuss POC to include:  *Expectant mgmt for 12 hrs s/p rupture *IV pain medication availability *Position changes as appropriate -No questions or concerns -Dr. [name] updated on patient status -Continue other mgmt as ordered  Riley Churches, [name] 02/16/2015, 8:06 AM

## 2015-02-16 NOTE — Progress Notes (Signed)
Pt ambulated safely to BR. EFM disconnected. Cat II Surveillance noted prior to removal of EFM.

## 2015-02-16 NOTE — Anesthesia Procedure Notes (Signed)
Epidural Patient location during procedure: OB Start time: 02/16/2015 7:34 PM  Staffing Anesthesiologist: Josephine Igo Performed by: anesthesiologist   Preanesthetic Checklist Completed: patient identified, site marked, surgical consent, pre-op evaluation, timeout performed, IV checked, risks and benefits discussed and monitors and equipment checked  Epidural Patient position: sitting Prep: site prepped and draped and DuraPrep Patient monitoring: continuous pulse ox and blood pressure Approach: midline Location: L3-L4 Injection technique: LOR air  Needle:  Needle type: Tuohy  Needle gauge: 17 G Needle length: 9 cm and 9 Needle insertion depth: 8 cm Catheter type: closed end flexible Catheter size: 19 Gauge Catheter at skin depth: 14 cm Test dose: negative and Other  Assessment Events: blood not aspirated, injection not painful, no injection resistance, negative IV test and no paresthesia  Additional Notes Patient identified. Risks and benefits discussed including failed block, incomplete  Pain control, post dural puncture headache, nerve damage, paralysis, blood pressure Changes, nausea, vomiting, reactions to medications-both toxic and allergic and post Partum back pain. All questions were answered. Patient expressed understanding and wished to proceed. Sterile technique was used throughout procedure. Epidural site was Dressed with sterile barrier dressing. No paresthesias, signs of intravascular injection Or signs of intrathecal spread were encountered. Difficult due to MO and poor positioning. Attempt x 3 Patient was more comfortable after the epidural was dosed. Please see RN's note for documentation of vital signs and FHR which are stable.

## 2015-02-16 NOTE — Progress Notes (Signed)
Subjective: Pt resting comfortable on left lateral side.  Family at bedside.   Objective: BP 123/69 mmHg  Pulse 92  Temp(Src) 98.2 F (36.8 C) (Axillary)  Resp 16  Ht 5\' 8"  (1.727 m)  Wt 121.11 kg (267 lb)  BMI 40.61 kg/m2  SpO2 99% I/O last 3 completed shifts: In: 500 [I.V.:500] Out: -  Total I/O In: 2402.1 [I.V.:2102.1; IV Piggyback:300] Out: -   FHT: Category  1 , 135 bpm, moderate variability, no accels, no decels UC:   regular, every 2-2.5 minutes SVE:   Dilation: 3.5 Effacement (%): 70 Station: Ballotable Exam by:: Emly, [name]  Membranes:SROM x 21hrs Internal Monitors: IUPC inserted-Amnioinfusion initiated Amnio infusion - infusing with good return   Augmentation/Induction: Pitocin: Cytotec: None Pitocin at 52millinuits    Assessment:  IUP at 38.4wks Cat I FT  Pitocin Augmentation MSAF Amnio infusion with good return   Plan: Continue to monitor abdominal tone  -Continue other mgmt as ordered  Jeanne Waller [name], [place] 02/16/2015, 10:28 PM

## 2015-02-16 NOTE — Progress Notes (Addendum)
Jeanne Waller MRN: [id number]  Subjective: -Nurse call reports patient up to BR with SROM of forebag and meconium noted.  In room to assess.  Patient reports gush of fluid and good fetal movement.    Objective: BP 101/59 mmHg  Pulse 109  Temp(Src) 97.9 F (36.6 C) (Oral)  Resp 16  Ht 5\' 8"  (1.727 m)  Wt 121.11 kg (267 lb)  BMI 40.61 kg/m2 I/O last 3 completed shifts: In: 500 [I.V.:500] Out: -     Fetal Monitoring: FHT: 150 bpm, Mod Var, -Decels, -Accels UC: None graphed or palpated    Vaginal Exam: SVE:   Dilation: 1 Effacement (%): 70 Station: Ballotable Exam by:: [name], [name] Membranes:SROM x 12hrs Internal Monitors: None  Augmentation/Induction: Pitocin:None Cytotec: None  Assessment:  IUP at 38.4wks Cat I FT  Negative CST PROM Unable to Confirm Vertex  Plan: -Strip reviewed with Dr. [name] prior to patient assessment -Discussed plan of initiating pitocin or waiting until 24 hrs s/p ROM -Reviewed plan with patient -Patient informed that [place] reassuring, but variability minimal and reviewed risk of fetal intolerance under stress of increased contractions -Reviewed risk of prolonged rupture including infection, maternal illness, and fetal distress -Reviewed inability to confirm fetal presentation via manual exam and informal bedside US -Will order formal BS [place] -Patient encouraged to take time and consider options--to inform nurse once decision is made -Continue other mgmt as ordered -Dr. [name] updated  [name],MSN, [name] 02/16/2015, 1:50 PM   Addendum: [place] confirms Vertex  [name], [name]

## 2015-02-16 NOTE — Progress Notes (Signed)
Pt repositioned from right tilt to left lateral position. Abd palpated and noted as firm. Peripad assessed, and moderate amount of clear fluid from amnioinfusion noted.

## 2015-02-16 NOTE — Progress Notes (Signed)
LINDSEA LELAND MRN: [id number]  Subjective: -Strip reviewed.  In room to assess.  Patient resting in bed.  Performing position changes independently.  Reports perception of fetal movement and contractions.   Objective: BP 122/75 mmHg  Pulse 92  Temp(Src) 97.5 F (36.4 C) (Oral)  Resp 18  Ht 5\' 8"  (1.727 m)  Wt 121.11 kg (267 lb)  BMI 40.61 kg/m2 I/O last 3 completed shifts: In: 500 [I.V.:500] Out: -     Fetal Monitoring: FHT: 140 bpm, Min Var, -Decels, -Accels UC: Q2-52min, palpates mild to moderate    Vaginal Exam: SVE:   Deferred Membranes:SROM x 9hrs Internal Monitors: None  Augmentation/Induction: Pitocin:None Cytotec: None  Assessment:  IUP at 38.4wks Cat II FT  Expectant Mgmt  Plan: -O2 applied via face mask -Patient to left side as a means of resolving minimal variability -Will continue to monitor -Will update Dr. [name] accordingly -Continue other mgmt as ordered  Riley Churches, [name] 02/16/2015, 10:39 AM

## 2015-02-16 NOTE — Progress Notes (Signed)
Pt to 168 from MAU via w/c safely. Pt admitted for SROm on 02/16/15 at 0100, clear fluid noted on peripad. Pt denies vaginal bleeding and decreased fetal movement. EFM applied to firm abd during mild uc. FHR located in LLQ at 145. Abd palp

## 2015-02-16 NOTE — Progress Notes (Signed)
Update provided to R. Stalls, [name]. [name] informed of Cat II FHR surveillance, interventions, and outcomes. No new orders required/received at this time. RN to continue to monitor.

## 2015-02-16 NOTE — Progress Notes (Signed)
Epidural procedure completed. Pt assisted to low-fowlers with left tilt for uterine displacement. EFM adjusted, FHR located in RLQ at 145 BPM. Mobility instructions R/T epidural cath reviewed, pt verbalizes understanding and can return correct demo.

## 2015-02-16 NOTE — Anesthesia Preprocedure Evaluation (Signed)
Anesthesia Evaluation  Patient identified by MRN, date of birth, ID band Patient awake    Reviewed: Allergy & Precautions, Patient's Chart, lab work & pertinent test results  Airway Mallampati: III  TM Distance: >3 FB Neck ROM: Full    Dental no notable dental hx. (+) Teeth Intact   Pulmonary neg pulmonary ROS, former smoker,    Pulmonary exam normal breath sounds clear to auscultation       Cardiovascular negative cardio ROS Normal cardiovascular exam Rhythm:Regular Rate:Normal     Neuro/Psych PSYCHIATRIC DISORDERS Bipolar Disorder negative neurological ROS     GI/Hepatic Neg liver ROS, PUD, GERD  Medicated and Controlled,  Endo/Other  Morbid obesity  Renal/GU negative Renal ROS  negative genitourinary   Musculoskeletal negative musculoskeletal ROS (+)   Abdominal (+) + obese,   Peds  Hematology   Anesthesia Other Findings   Reproductive/Obstetrics (+) Pregnancy                             Anesthesia Physical Anesthesia Plan  ASA: III  Anesthesia Plan: Epidural   Post-op Pain Management:    Induction:   Airway Management Planned: Natural Airway  Additional Equipment:   Intra-op Plan:   Post-operative Plan:   Informed Consent: I have reviewed the patients History and Physical, chart, labs and discussed the procedure including the risks, benefits and alternatives for the proposed anesthesia with the patient or authorized representative who has indicated his/her understanding and acceptance.     Plan Discussed with: Anesthesiologist  Anesthesia Plan Comments:         Anesthesia Quick Evaluation

## 2015-02-16 NOTE — Progress Notes (Signed)
[name] to bedside, SVE performed, pushing initiated.

## 2015-02-16 NOTE — H&P (Addendum)
Jeanne Waller is a 34 y.o. female, G3P1011 at 38.4 weeks, presenting for Premature rupture of membranes. Reports PROM at 0100 am this morning with blood on her underwear, leaking of fluid, and  vaginal pain/cramping. +FM,  Denies contractions.   Patient Active Problem List   Diagnosis Date Noted  . Female pelvic pain 10/05/2013  . Fibroid 10/05/2013  . Endometriosis determined by laparoscopy 10/05/2013  . Healthcare maintenance 10/30/2010  . Bipolar disorder ([place])   . History of tobacco use 02/14/2007  . DYSPEPSIA 08/19/2006    History of present pregnancy: Patient entered care at 10.2 weeks.   EDC of 12/20/16was established by LMP.   Anatomy scan:  18.6 weeks  (10/01/14), with normal findings and an posterior placenta.   Singleton pregnancy, breech presentation, posterior placenta, low lying= placental edge is 0.70 cm from internal os. Cervix closed. Fluid is normal ( vertical pocket = 4.3cm)   Profile/palate nasal bone+seen .philtrum seen.  Open hands,  5th digit, heel, and feet seen,  Does not wish to know gender at this time. RVOT not well visualized to position.  Ovaries and adnexa are within normal limits. Fibriods.Marland KitchenMarland Kitchen1. Anterior Intramural = 2.5 cm X  1.1cm X 1.9 cm 2. Anterior left intraural= 1.8 cm X 2.3 cm X 2.1cm  Additional [place] evaluations:   23.6 wks ( 11/05/14) : [place] F/U:  Nelda [place] pregnancy, vertex presentation, posterior placenta - placental edge is 3.9 cm from internal os. Cervix is closed. RVOT seen. Anatomy is complete. Fibroids appear stable.  35.1 wks (01/29/15):  Singleton pregnancy. Vertex presentation. AFI 55%, EFW =2976-76%   Significant prenatal events:  Typical labor course.  Hx of endometriosis - may be candidate for endometriosis study after delviery.   Hx Bipolar Disorder-  Currently on Meds and is managed by Dr. Harlene Ramus.  Considering Mirena for birth control after delivery.    Last evaluation:  02/13/15   LCarter, FNP  FH 43 cm, FHR 150bpm,  BP 110/60,  268 lbs,   +1 edema     C/o pelvic pressure  OB History    Gravida Para Term Preterm AB TAB SAB Ectopic Multiple Living   3 1 1  1     1      Past Medical History  Diagnosis Date  . Bipolar disorder ([place])   . PUD (peptic ulcer disease)     controlled with nexium in past  . History of chicken pox   . Endometriosis 2015    s/p ex lap with endometrioma removal  . Bipolar 1 disorder [facility])    Past Surgical History  Procedure Laterality Date  . Colposcopy      abnormal pap - positive HPV  . Diagnostic laparoscopy  09/2013    for chronic pelvic pain and uterine fibroids  . Laparoscopy N/A 10/05/2013    Procedure: LAPAROSCOPY OPERATIVE;  Surgeon: Eldred Manges, MD;  Location: [facility];  Service: Gynecology;  Laterality: N/A;  Please have Harmonic scalpel available  . Laparoscopic lysis of adhesions N/A 10/05/2013    Procedure: LAPAROSCOPIC LYSIS OF ADHESIONS;  Surgeon: Eldred Manges, MD;  Location: [facility];  Service: Gynecology;  Laterality: N/A;  . Induced abortion  2002   Family History: family history includes Cancer in her maternal grandmother; Diabetes in her maternal aunt and maternal grandmother; Heart disease in her maternal grandmother and maternal uncle; Hypertension in her sister. There is no history of Stroke or Coronary artery disease. Social History:  reports that she quit smoking about 7  years ago. She has never used smokeless tobacco. She reports that she drinks alcohol. She reports that she does not use illicit drugs.   African American, works as a Passenger transport manager at [employer].  Has a 2 year college education, Is of the [employer]ia religion and is currently single.   Prenatal Transfer Tool  Maternal Diabetes: No Genetic Screening: Normal Maternal Ultrasounds/Referrals: Normal Fetal Ultrasounds or other Referrals:  None Maternal Substance Abuse:  No Significant Maternal Medications:  Meds include: Other:  Lamotrigine 200g tablet, Seroquel XR 200mg  Significant Maternal  Lab Results: Lab values include: Group B Strep positive  TDAP 12/10/14 Flu 12/2014  ROS:  Lof, VB, +FM, denies contraction  No Known Allergies   Dilation: 1 Effacement (%): 60 Station: -3 Exam by:: Apolonio Schneiders [name] Blood pressure 131/73, pulse 109, temperature 98.2 F (36.8 C), resp. rate 18, height 5\' 8"  (1.727 m), weight 121.11 kg (267 lb).  Chest clear Heart RRR without murmur Abd gravid, NT, FH 43 cm Pelvic: proven to 7lbs 4oz Ext: wnl  FHR: Category  1 -  145 bpm, moderate variability, no accels, no decels  UCs:    Irritability   Prenatal labs: ABO, Rh:     O, positive Antibody:     Negative Rubella:  !Error!   NOT IMMUNE RPR:    NR   07/16/14, 12/10/14 HBsAg:   Negative 07/16/14 HIV:   NR 07/16/14, 12/10/14 GBS: Positive (11/22 0000) Sickle cell/Hgb electrophoresis:  AA%    Pap:  Wnl 01/12/2014 GC:  Negative   07/16/14, 01/29/15 Chlamydia:  Negative  07/16/14, 01/29/15 Genetic screenings:  Negative , 1rst trimester screen, NT Glucola:   wnl Other:   Hgb 12.4 at NOB, 12.4 at 28 weeks    Assessment/Plan: IUP at  38.4 wks PROM at  0100 GBS positive Cat 1 FHT  Vertex  AMA Bipoloar - on meds Endometriosis Uterine Leiomyma Rubella Non immune  Plan: Admit to [place] per consult with Dr. Alesia Richards Routine [facility] orders Pain med/epidural prn PCN G for GBS prophylaxis Offer Rubella immunization postpartum  Cherre Huger, [place] 02/16/2015, 5:22 AM    Patient desires permanent sterilization via postpartum tubal ligation.  We discussed risks, benefits and alternatives of bilateral tubal ligation including risks of regret, bleeding, infection and damage to organs.  She understands there are other options of temporary birth control such as pills, depo, patch, IUD, implants and female vasectomy, but she does not desire these other temporary options. She understands there is a small risk of failure with an accompanying increased risk of ectopic pregnancy in case of  failure of the tubal ligation.  All questions were answered. Dr. Alesia Richards.

## 2015-02-16 NOTE — MAU Note (Signed)
Think my water broke. Have leaked fld several times and just continues to leak. Dark brownish, bloody color. Some constant "bad period cramp" in lower abd. Good FM.

## 2015-02-16 NOTE — Progress Notes (Signed)
Bedside report given and care relinquished to D. Ardis Hughs, [place].

## 2015-02-16 NOTE — Progress Notes (Signed)
Dr. Royce Macadamia of anesthesia to bedside, informed consent presented including risks and benefits. Pt agrees to procedure, consent signed, pt assisted to sitting position on side of bed. Cat II FHR surveillance noted, continuous EFM in progress for procedure.

## 2015-02-17 ENCOUNTER — Encounter (HOSPITAL_COMMUNITY): Payer: Self-pay | Admitting: Student

## 2015-02-17 ENCOUNTER — Inpatient Hospital Stay (HOSPITAL_COMMUNITY): Payer: PRIVATE HEALTH INSURANCE | Admitting: Anesthesiology

## 2015-02-17 ENCOUNTER — Encounter (HOSPITAL_COMMUNITY)
Admission: AD | Disposition: A | Discharge status: Home / Self Care | Payer: Self-pay | Source: Ambulatory Visit | Attending: Obstetrics and Gynecology

## 2015-02-17 HISTORY — PX: TUBAL LIGATION: SHX77

## 2015-02-17 LAB — CBC
HCT: 34.8 % — ABNORMAL LOW (ref 36.0–46.0)
HEMOGLOBIN: 11.6 g/dL — AB (ref 12.0–15.0)
MCH: 31.6 pg (ref 26.0–34.0)
MCHC: 33.3 g/dL (ref 30.0–36.0)
MCV: 94.8 fL (ref 78.0–100.0)
Platelets: 159 10*3/uL (ref 150–400)
RBC: 3.67 MIL/uL — ABNORMAL LOW (ref 3.87–5.11)
RDW: 14.5 % (ref 11.5–15.5)
WBC: 13.4 10*3/uL — ABNORMAL HIGH (ref 4.0–10.5)

## 2015-02-17 SURGERY — LIGATION, FALLOPIAN TUBE, POSTPARTUM
Anesthesia: Epidural | Site: Abdomen | Laterality: Bilateral

## 2015-02-17 MED ORDER — MIDAZOLAM HCL 2 MG/2ML IJ SOLN
INTRAMUSCULAR | Status: DC | PRN
  Administered 2015-02-17 (×4): 0.5 mg via INTRAVENOUS

## 2015-02-17 MED ORDER — MIDAZOLAM HCL 2 MG/2ML IJ SOLN
INTRAMUSCULAR | Status: AC
  Filled 2015-02-17: qty 2

## 2015-02-17 MED ORDER — SENNOSIDES-DOCUSATE SODIUM 8.6-50 MG PO TABS
2.0000 | ORAL_TABLET | ORAL | Status: DC
  Administered 2015-02-18 (×2): 2 via ORAL
  Filled 2015-02-17 (×2): qty 2

## 2015-02-17 MED ORDER — PROMETHAZINE HCL 25 MG/ML IJ SOLN: 6.2500 mg | INTRAMUSCULAR | Status: DC | PRN

## 2015-02-17 MED ORDER — DEXTROSE IN LACTATED RINGERS 5 % IV SOLN
INTRAVENOUS | Status: AC
  Administered 2015-02-17: 04:00:00 via INTRAVENOUS

## 2015-02-17 MED ORDER — ACETAMINOPHEN 325 MG PO TABS: 650.0000 mg | ORAL_TABLET | ORAL | Status: DC | PRN

## 2015-02-17 MED ORDER — TETANUS-DIPHTH-ACELL PERTUSSIS 5-2.5-18.5 LF-MCG/0.5 IM SUSP: 0.5000 mL | Freq: Once | INTRAMUSCULAR | Status: DC

## 2015-02-17 MED ORDER — ONDANSETRON HCL 4 MG/2ML IJ SOLN
INTRAMUSCULAR | Status: AC
  Filled 2015-02-17: qty 2

## 2015-02-17 MED ORDER — FENTANYL CITRATE (PF) 100 MCG/2ML IJ SOLN: 25.0000 ug | INTRAMUSCULAR | Status: DC | PRN

## 2015-02-17 MED ORDER — ONDANSETRON HCL 4 MG/2ML IJ SOLN
INTRAMUSCULAR | Status: DC | PRN
  Administered 2015-02-17: 4 mg via INTRAVENOUS

## 2015-02-17 MED ORDER — LANOLIN HYDROUS EX OINT: TOPICAL_OINTMENT | CUTANEOUS | Status: DC | PRN

## 2015-02-17 MED ORDER — OXYCODONE-ACETAMINOPHEN 5-325 MG PO TABS
2.0000 | ORAL_TABLET | ORAL | Status: DC | PRN
  Administered 2015-02-17 – 2015-02-19 (×5): 2 via ORAL
  Filled 2015-02-17 (×5): qty 2

## 2015-02-17 MED ORDER — DIBUCAINE 1 % RE OINT: 1.0000 "application " | TOPICAL_OINTMENT | RECTAL | Status: DC | PRN

## 2015-02-17 MED ORDER — QUETIAPINE FUMARATE 200 MG PO TABS
200.0000 mg | ORAL_TABLET | Freq: Every day | ORAL | Status: DC
  Administered 2015-02-17 – 2015-02-18 (×2): 200 mg via ORAL
  Filled 2015-02-17 (×2): qty 1

## 2015-02-17 MED ORDER — ONDANSETRON HCL 4 MG PO TABS: 4.0000 mg | ORAL_TABLET | ORAL | Status: DC | PRN

## 2015-02-17 MED ORDER — PRENATAL MULTIVITAMIN CH
1.0000 | ORAL_TABLET | Freq: Every day | ORAL | Status: DC
  Administered 2015-02-17 – 2015-02-19 (×3): 1 via ORAL
  Filled 2015-02-17 (×3): qty 1

## 2015-02-17 MED ORDER — LACTATED RINGERS IV SOLN: INTRAVENOUS | Status: DC

## 2015-02-17 MED ORDER — SIMETHICONE 80 MG PO CHEW
80.0000 mg | CHEWABLE_TABLET | ORAL | Status: DC | PRN
  Administered 2015-02-17: 80 mg via ORAL
  Filled 2015-02-17: qty 1

## 2015-02-17 MED ORDER — ZOLPIDEM TARTRATE 5 MG PO TABS: 5.0000 mg | ORAL_TABLET | Freq: Every evening | ORAL | Status: DC | PRN

## 2015-02-17 MED ORDER — FENTANYL CITRATE (PF) 100 MCG/2ML IJ SOLN
INTRAMUSCULAR | Status: AC
  Filled 2015-02-17: qty 2

## 2015-02-17 MED ORDER — LIDOCAINE-EPINEPHRINE 1 %-1:100000 IJ SOLN
INTRAMUSCULAR | Status: DC | PRN
  Administered 2015-02-17: 11 mL

## 2015-02-17 MED ORDER — BENZOCAINE-MENTHOL 20-0.5 % EX AERO
1.0000 "application " | INHALATION_SPRAY | CUTANEOUS | Status: DC | PRN
  Administered 2015-02-17: 1 via TOPICAL
  Filled 2015-02-17: qty 56

## 2015-02-17 MED ORDER — LIDOCAINE-EPINEPHRINE 1 %-1:100000 IJ SOLN
INTRAMUSCULAR | Status: AC
  Filled 2015-02-17: qty 1

## 2015-02-17 MED ORDER — OXYCODONE-ACETAMINOPHEN 5-325 MG PO TABS
1.0000 | ORAL_TABLET | ORAL | Status: DC | PRN
  Administered 2015-02-17 (×2): 1 via ORAL
  Filled 2015-02-17 (×2): qty 1

## 2015-02-17 MED ORDER — LAMOTRIGINE 200 MG PO TABS
400.0000 mg | ORAL_TABLET | Freq: Every day | ORAL | Status: DC
  Administered 2015-02-17 – 2015-02-18 (×2): 400 mg via ORAL
  Filled 2015-02-17 (×2): qty 2

## 2015-02-17 MED ORDER — LIDOCAINE-EPINEPHRINE (PF) 2 %-1:200000 IJ SOLN
INTRAMUSCULAR | Status: DC | PRN
  Administered 2015-02-17 (×2): 5 mL via EPIDURAL

## 2015-02-17 MED ORDER — IBUPROFEN 600 MG PO TABS
600.0000 mg | ORAL_TABLET | Freq: Four times a day (QID) | ORAL | Status: DC
  Administered 2015-02-17 – 2015-02-19 (×9): 600 mg via ORAL
  Filled 2015-02-17 (×9): qty 1

## 2015-02-17 MED ORDER — DEXTROSE IN LACTATED RINGERS 5 % IV SOLN: INTRAVENOUS | Status: AC

## 2015-02-17 MED ORDER — ONDANSETRON HCL 4 MG/2ML IJ SOLN: 4.0000 mg | INTRAMUSCULAR | Status: DC | PRN

## 2015-02-17 MED ORDER — WITCH HAZEL-GLYCERIN EX PADS: 1.0000 "application " | MEDICATED_PAD | CUTANEOUS | Status: DC | PRN

## 2015-02-17 MED ORDER — SODIUM BICARBONATE 8.4 % IV SOLN
INTRAVENOUS | Status: AC
  Filled 2015-02-17: qty 50

## 2015-02-17 MED ORDER — DIPHENHYDRAMINE HCL 25 MG PO CAPS: 25.0000 mg | ORAL_CAPSULE | Freq: Four times a day (QID) | ORAL | Status: DC | PRN

## 2015-02-17 MED ORDER — FENTANYL CITRATE (PF) 100 MCG/2ML IJ SOLN
INTRAMUSCULAR | Status: DC | PRN
  Administered 2015-02-17 (×2): 25 ug via INTRAVENOUS

## 2015-02-17 MED ORDER — LACTATED RINGERS IV SOLN
INTRAVENOUS | Status: DC
  Administered 2015-02-17 (×2): via INTRAVENOUS

## 2015-02-17 MED ORDER — FAMOTIDINE 20 MG PO TABS
40.0000 mg | ORAL_TABLET | Freq: Once | ORAL | Status: AC
  Administered 2015-02-17: 40 mg via ORAL
  Filled 2015-02-17: qty 2

## 2015-02-17 MED ORDER — MEPERIDINE HCL 25 MG/ML IJ SOLN: 6.2500 mg | INTRAMUSCULAR | Status: DC | PRN

## 2015-02-17 MED ORDER — METOCLOPRAMIDE HCL 10 MG PO TABS
10.0000 mg | ORAL_TABLET | Freq: Once | ORAL | Status: AC
  Administered 2015-02-17: 10 mg via ORAL
  Filled 2015-02-17: qty 1

## 2015-02-17 MED ORDER — LIDOCAINE-EPINEPHRINE (PF) 2 %-1:200000 IJ SOLN
INTRAMUSCULAR | Status: AC
  Filled 2015-02-17: qty 20

## 2015-02-17 SURGICAL SUPPLY — 23 items
CHLORAPREP W/TINT 26ML (MISCELLANEOUS) ×2 IMPLANT
CLOTH BEACON ORANGE TIMEOUT ST (SAFETY) ×2 IMPLANT
CONTAINER PREFILL 10% NBF 15ML (MISCELLANEOUS) ×4 IMPLANT
DRSG OPSITE POSTOP 3X4 (GAUZE/BANDAGES/DRESSINGS) ×2 IMPLANT
ELECT REM PT RETURN 9FT ADLT (ELECTROSURGICAL) ×2
ELECTRODE REM PT RTRN 9FT ADLT (ELECTROSURGICAL) IMPLANT
GLOVE BIOGEL PI IND STRL 7.0 (GLOVE) ×2 IMPLANT
GLOVE BIOGEL PI INDICATOR 7.0 (GLOVE) ×2
GLOVE ECLIPSE 6.5 STRL STRAW (GLOVE) ×2 IMPLANT
GOWN STRL REUS W/TWL LRG LVL3 (GOWN DISPOSABLE) ×4 IMPLANT
LIQUID BAND (GAUZE/BANDAGES/DRESSINGS) ×2 IMPLANT
NEEDLE HYPO 22GX1.5 SAFETY (NEEDLE) ×2 IMPLANT
NS IRRIG 1000ML POUR BTL (IV SOLUTION) ×2 IMPLANT
PACK ABDOMINAL MINOR (CUSTOM PROCEDURE TRAY) ×2 IMPLANT
PENCIL BUTTON HOLSTER BLD 10FT (ELECTRODE) ×2 IMPLANT
SPONGE LAP 4X18 X RAY DECT (DISPOSABLE) ×1 IMPLANT
SUT MON AB 4-0 PS1 27 (SUTURE) ×2 IMPLANT
SUT PLAIN 1 NONE 54 (SUTURE) ×2 IMPLANT
SUT VIC AB 0 CT1 27 (SUTURE) ×2
SUT VIC AB 0 CT1 27XBRD ANBCTR (SUTURE) ×1 IMPLANT
SYR CONTROL 10ML LL (SYRINGE) ×2 IMPLANT
TOWEL OR 17X24 6PK STRL BLUE (TOWEL DISPOSABLE) ×4 IMPLANT
TRAY FOLEY CATH SILVER 14FR (SET/KITS/TRAYS/PACK) ×2 IMPLANT

## 2015-02-17 NOTE — Procedures (Signed)
Patient: Jeanne Waller, Jeanne Waller [id number]  PREOP DIAGNOSIS: Multiparous patient desiring permanent sterilization.  POSTOP DIAGNOSIS: Same as above  PROCEDURE: Post partum bilateral tubal ligation via modified Pomeroy Method.  SURGEON: Dr. Waymon Amato  ASSISTANT: Scrub tech: Maudie Mercury  ANESTHESIA: Bolused epidural.  COMPLICATIONS: None  EBL: 5 mL  IV FLUID: 800 mL LR  URINE OUTPUT: foley cathete  FINDINGS: Normal uterus. Normal fallopian tubes bilaterally.   PROCEDURE:   Informed consent was obtained from the patient to undergo the procedure after discussing the risks benefits and alternatives of the procedure including risks of tubal failure, tubal regret, bleeding, infection and damage to organs.  She also understood that there are other temporary methods of birth control, and also female sterilization and she did not desire these other options.  She was taken to the operating room where anesthesia was administered without difficulty.  She was prepped abdominally in the usual sterile fashion. Foley catheter was placed in the bladder.    1% lidocaine with epinephrine was instilled in the infraumbilical area. The skin was incised with a scalpel and and entry into the abdomen was made through the subcutaneous layer, fascia and peritoneal layers using Allis clamps, hemostats, retractors and metzenbaum scissors.  The patient was tilted to her left side and the left fallopian tube was identified and followed up to the fimbria end through the incision. A portion of the  Fallopian tube about 3 cm distal to the uterus cornua was grasped with a Babcock clamp and free tie of 1-0 plain used to isolate this 2 cm portion of tube. Another tie was placed below the first one. The mesosalpinx was transected and then the portion of the isolated tube was transected using Metzenbaums. The edges of the cut tubes were further trimmed. The Bovie was used over the tubal edges to enhance hemostasis. The tube was then returned  into the abdominal cavity. The patient was kept on her left side and the right fallopian tube was identified. This was similarly followed up to the fimbria and and a portion of the tube isolated and transected as done on the left side. Excellent hemostasis was noted. The fascia was then closed using 0 Vicryl in a running stitch.  The skin was closed using 4-0 Monocryl subcuticular stitch. Dermabond and Tegaderm dressing were placed. The lap instrument and needle counts were correct. She was taken to the operating room in a stable condition.  SPECIMEN: Portions of left and right fallopian tubes

## 2015-02-17 NOTE — Brief Op Note (Signed)
02/16/2015 - 02/17/2015  12:35 PM  PATIENT:  Blair Promise L Eslick  34 y.o. female  PRE-OPERATIVE DIAGNOSIS:  desires sterilization  POST-OPERATIVE DIAGNOSIS:  desires sterilization  PROCEDURE:  Procedure(s): POST PARTUM TUBAL LIGATION (Bilateral)  SURGEON:  Surgeon(s) and Role:    * Waymon Amato, MD - Primary  ASSISTANTS: Scrub Tech: Joelene Millin.    ANESTHESIA:   epidural  EBL:  Total I/O In: 800 [I.V.:800] Out: 5 [Blood:5]  BLOOD ADMINISTERED:none  DRAINS: none   LOCAL MEDICATIONS USED:  LIDOCAINE  WITH EPINEPHRINE: 7 CC  SPECIMEN:  Source of Specimen:  Portions of left and right fallopian tubes.   DISPOSITION OF SPECIMEN:  PATHOLOGY  COUNTS:  YES  TOURNIQUET:  * No tourniquets in log *  DICTATION: .Dragon Dictation  PLAN OF CARE: Admit to inpatient   PATIENT DISPOSITION:  PACU - hemodynamically stable.   Delay start of Pharmacological VTE agent (>24hrs) due to surgical blood loss or risk of bleeding: not applicable

## 2015-02-17 NOTE — Anesthesia Preprocedure Evaluation (Signed)
Anesthesia Evaluation  Patient identified by MRN, date of birth, ID band Patient awake    Reviewed: Allergy & Precautions, Patient's Chart, lab work & pertinent test results  Airway Mallampati: III  TM Distance: >3 FB Neck ROM: Full    Dental no notable dental hx. (+) Teeth Intact   Pulmonary neg pulmonary ROS, former smoker,    Pulmonary exam normal breath sounds clear to auscultation       Cardiovascular negative cardio ROS Normal cardiovascular exam Rhythm:Regular Rate:Normal     Neuro/Psych PSYCHIATRIC DISORDERS Bipolar Disorder negative neurological ROS     GI/Hepatic Neg liver ROS, PUD, GERD  Medicated and Controlled,  Endo/Other  Morbid obesity  Renal/GU negative Renal ROS  negative genitourinary   Musculoskeletal negative musculoskeletal ROS (+)   Abdominal (+) + obese,   Peds  Hematology   Anesthesia Other Findings   Reproductive/Obstetrics (+) Pregnancy                             Anesthesia Physical  Anesthesia Plan  ASA: III  Anesthesia Plan: Epidural   Post-op Pain Management:    Induction:   Airway Management Planned: Natural Airway  Additional Equipment:   Intra-op Plan:   Post-operative Plan:   Informed Consent: I have reviewed the patients History and Physical, chart, labs and discussed the procedure including the risks, benefits and alternatives for the proposed anesthesia with the patient or authorized representative who has indicated his/her understanding and acceptance.     Plan Discussed with: Anesthesiologist  Anesthesia Plan Comments:         Anesthesia Quick Evaluation

## 2015-02-17 NOTE — Progress Notes (Signed)
CLINICAL SOCIAL WORK MATERNAL/CHILD NOTE  Patient Details  Name: Jeanne Waller MRN: [id number] Date of Birth: 02/17/2017  Date: 02/17/2017  Clinical Social Worker Initiating Note: Norlene Duel, LCSWDate/ Time Initiated: 02/17/15/    Child's Name: Jeanne Waller   Legal Guardian:  (Parents Marjorie Smolder and Henri Medal)   Need for Interpreter: None   Date of Referral: 02/17/15   Reason for Referral: Other (Comment)   Referral Source: [facility]   Address: [address] [place], [place] [address]  Phone number:  [phone])   Household Members: Self, Minor Children   Natural Supports (not living in the home): Immediate Family, Extended Family, Spouse/significant other   Professional Supports:Therapist Heritage manager)   Employment:Full-time (Both parents employed)   Type of Work:     Education:     Printmaker   Other Resources:     Cultural/Religious Considerations Which May Impact Care: none noted  Strengths: Ability to meet basic needs , Home prepared for child    Risk Factors/Current Problems:  (hx of mental illness)   Cognitive State: Alert , Able to Concentrate    Mood/Affect: Happy    CSW Assessment: Acknowledged order for social work consult to assess mother's hx of bipolar. Met with mother who was pleasant and receptive to social work. She is a single parent with one other dependent age 67. MOB reports hx of bipolar and states that she has been stable on medication. She denies any worsening symptoms during pregnancy. Informed that she is seeing a therapist and a psychiatrist. She denies any current symptoms of depression or anxiety. She also denies any hx of PP Depression. She denies any use of alcohol or illicit drug use during pregnancy. No acute social concerns noted or reported at this time. FOB was present during CSW visit and was very attentive to newborn.  Affect and behavior was appropriate during the entire visit. Mother informed of social work Fish farm manager.  CSW Plan/Description:    Discussed signs/symptoms of PP Depression and available resources No further intervention required No barriers to discharge   Eilleen Davoli J, LCSW 02/17/2015, 11:16 AM

## 2015-02-17 NOTE — Anesthesia Postprocedure Evaluation (Signed)
Anesthesia Post Note  Patient: Jeanne Waller  Procedure(s) Performed: Procedure(s) (LRB): POST PARTUM TUBAL LIGATION (Bilateral)  Patient location during evaluation: PACU Anesthesia Type: Epidural Level of consciousness: oriented and awake and alert Pain management: pain level controlled Vital Signs Assessment: post-procedure vital signs reviewed and stable Respiratory status: spontaneous breathing, respiratory function stable and patient connected to nasal cannula oxygen Cardiovascular status: blood pressure returned to baseline and stable Postop Assessment: no headache and no backache Anesthetic complications: no    Last Vitals:  Filed Vitals:   02/17/15 1420 02/17/15 1808  BP: 122/78 117/74  Pulse: 96 92  Temp:  36.6 C  Resp: 20 18    Last Pain:  Filed Vitals:   02/17/15 2021  PainSc: 5                  Montez Hageman

## 2015-02-17 NOTE — Transfer of Care (Signed)
Immediate Anesthesia Transfer of Care Note  Patient: Jeanne Waller  Procedure(s) Performed: Procedure(s): POST PARTUM TUBAL LIGATION (Bilateral)  Patient Location: PACU  Anesthesia Type:Epidural  Level of Consciousness: awake, alert  and oriented  Airway & Oxygen Therapy: Patient Spontanous Breathing  Post-op Assessment: Report given to RN and Post -op Vital signs reviewed and stable  Post vital signs: Reviewed and stable  Last Vitals:  Filed Vitals:   02/17/15 0330 02/17/15 0427  BP: 131/73 126/73  Pulse: 98 96  Temp: 37.1 C 36.9 C  Resp: 16 18    Complications: No apparent anesthesia complications

## 2015-02-17 NOTE — Progress Notes (Addendum)
Jeanne Waller  Post Partum Day 1:S/P SVD repair of 2nd degree and BL Lacerations Subjective: Patient up ad lib, denies syncope or dizziness. She is currently NPO, but denies N/V. Denies issues with urination and reports bleeding is "about as heavy as yesterday."  Patient is breastfeeding and reports going well.  Desires BTL for postpartum contraception, which is scheduled for 1115 this morning.  Pain is being appropriately managed with use of motrin.  Objective: Filed Vitals:   02/17/15 0201 02/17/15 0216 02/17/15 0330 02/17/15 0427  BP: 128/73 131/69 131/73 126/73  Pulse: 97 96 98 96  Temp:   98.8 F (37.1 C) 98.5 F (36.9 C)  TempSrc:   Oral Oral  Resp:   16 18  Height:      Weight:      SpO2:   98%     Recent Labs  02/16/15 0535 02/17/15 0517  HGB 12.0 11.6*  HCT 36.0 34.8*    Physical Exam:  General: alert, cooperative and no distress Mood/Affect: Appropriate/Appropriate Lungs: clear to auscultation, no wheezes, rales or rhonchi, symmetric air entry.  Heart: normal rate and regular rhythm. Breast: not examined. Abdomen:  + bowel sounds, Soft, NT Uterine Fundus: firm, U/+1 Lochia: appropriate Laceration: Not Assessed Skin: Warm, Dry DVT Evaluation: No evidence of DVT seen on physical exam.  Assessment S/P Vaginal Delivery-Day 1 Normal Involution BreastFeeding BTL  Plan:  Tubal scheduled for 1115 No questions or concerns Maintain NPO until s/p surgery Plan for d/c tomorrow Continue current care Dr. Wilma Flavin to follow as appropriate   Jeanne Conners, MSN, [name] 02/17/2015, 9:20 AM   I saw and examined patient and agree with above findings, assessment and plan.   Patient desires permanent sterilization via postpartum tubal ligation.  We discussed risks, benefits and alternatives of bilateral tubal ligation including risks of regret, bleeding, infection and damage to organs.  She understands there are other options of temporary birth control such as pills, depo,  patch, IUD, implants and female vasectomy, but she does not desire these other temporary options. She understands there is a small risk of failure with an accompanying increased risk of ectopic pregnancy in case of failure of the tubal ligation.  All questions were answered. Dr. Alesia Richards.

## 2015-02-17 NOTE — Progress Notes (Signed)
Pt assisted to BR for initial PP void. Pt transferred safely via stedy device. Unable to void. Pt returned to bed, I/O cath performed. Pt educated on perineal hygiene and can return correct demo. Pt asisted to w/c safely and prepped for routine transfer to M/B.

## 2015-02-17 NOTE — Lactation Note (Addendum)
This note was copied from the chart of Jeanne Tomoko Nevels. Lactation Consultation Note  Patient Name: Jeanne Waller Date: 02/17/2015 Reason for consult: Initial assessment   Initial Consult with exp BF mom. Infant with 5 BF for 30-75 minutes, 1 void and 1 stool since birth. LATCH scores have been 9-10 by bedside RN. MOm reports nipple tenderness with latch. She is using Lanolin, encouraged her to use EBM and olive or coconut oil to nipples. Enc mom to BF 8-12 x in 24 hours at first feeding cues. Discussed stomach size, NB Nutritional needs, cluster feeds, NB feeding behavior, and milk coming to volume. Mom plans to get a DEBP fron her employer, [employer]. [facility] Brochure given, informed of OP services, Support Groups and phone #. BF Resources Handout given and explained. Enc mom to call with questions/concerns/assistance.   Maternal Data Formula Feeding for Exclusion: No Does the patient have breastfeeding experience prior to this delivery?: Yes  Feeding Feeding Type: Breast Fed Length of feed: 30 min  LATCH Score/Interventions                      Lactation Tools Discussed/Used WIC Program: No   Consult Status Consult Status: PRN    Donn Pierini 02/17/2015, 4:53 PM

## 2015-02-18 ENCOUNTER — Encounter (HOSPITAL_COMMUNITY): Payer: Self-pay | Admitting: Obstetrics & Gynecology

## 2015-02-18 LAB — CBC
HCT: 29.3 % — ABNORMAL LOW (ref 36.0–46.0)
Hemoglobin: 9.6 g/dL — ABNORMAL LOW (ref 12.0–15.0)
MCH: 31.7 pg (ref 26.0–34.0)
MCHC: 32.8 g/dL (ref 30.0–36.0)
MCV: 96.7 fL (ref 78.0–100.0)
PLATELETS: 155 10*3/uL (ref 150–400)
RBC: 3.03 MIL/uL — ABNORMAL LOW (ref 3.87–5.11)
RDW: 14.9 % (ref 11.5–15.5)
WBC: 9.1 10*3/uL (ref 4.0–10.5)

## 2015-02-18 MED ORDER — FERROUS SULFATE 325 (65 FE) MG PO TABS
325.0000 mg | ORAL_TABLET | Freq: Every day | ORAL | Status: DC
  Administered 2015-02-18 – 2015-02-19 (×2): 325 mg via ORAL
  Filled 2015-02-18 (×2): qty 1

## 2015-02-18 NOTE — Anesthesia Postprocedure Evaluation (Signed)
Anesthesia Post Note  Patient: Jeanne Waller  Procedure(s) Performed: * No procedures listed *  Patient location during evaluation: Mother Baby Anesthesia Type: Epidural Level of consciousness: awake Pain management: satisfactory to patient Vital Signs Assessment: post-procedure vital signs reviewed and stable Respiratory status: spontaneous breathing Cardiovascular status: stable Anesthetic complications: no    Last Vitals:  Filed Vitals:   02/18/15 0220 02/18/15 0625  BP: 90/67 117/68  Pulse: 93 98  Temp: 36.8 C 36.9 C  Resp: 18 20    Last Pain:  Filed Vitals:   02/18/15 0659  PainSc: 2                  Lakeisha Waldrop

## 2015-02-18 NOTE — Addendum Note (Signed)
Addendum  created 02/18/15 [id number] by Asher Muir, CRNA   Modules edited: Clinical Notes   Clinical Notes:  File: [id number]

## 2015-02-18 NOTE — Lactation Note (Signed)
This note was copied from the chart of Jeanne Thomasina Liera. Lactation Consultation Note  Baby has been at the breast often but is not transferring well.  It is noted that she does not elevate her tongue when when she cries.  She is able to pull a gloved finger into her mouth but loses suction.  A NS #20 was inititated to see if the adding length and structure would improve the suckle.  Mom was more comfortable and a few swallows were heard but the NS did not have moisture in it when she detached from the first side.Marland Kitchen  Spoon feeding was also intiated but it was difficult to hand express colostrum.  Double electric breast pump to be set up at the bedside. MOm to express after each feeding and spoon fed back to [place].  Changed to a # 24 on the second side and mom reported it felt better.  Cheeks squeezes also seemed to help with baby's suction but they will be difficult for mom to perform as she is busy with breast massage and compression.  Patient Name: Jeanne Waller [id number] Date: 02/18/2015 Reason for consult: Follow-up assessment   Maternal Data    Feeding Feeding Type: Breast Fed Length of feed: 10 min  LATCH Score/Interventions Latch: Repeated attempts needed to sustain latch, nipple held in mouth throughout feeding, stimulation needed to elicit sucking reflex.  Audible Swallowing: A few with stimulation  Type of Nipple: Everted at rest and after stimulation  Comfort (Breast/Nipple): Soft / non-tender  Problem noted: Mild/Moderate discomfort Interventions (Mild/moderate discomfort): Hand expression  Hold (Positioning): Assistance needed to correctly position infant at breast and maintain latch.  LATCH Score: 7  Lactation Tools Discussed/Used Tools: Nipple Shields Nipple shield size: 20   Consult Status Consult Status: Follow-up Date: 02/19/15 Follow-up type: In-patient    Van Clines 02/18/2015, 9:22 AM

## 2015-02-18 NOTE — Progress Notes (Signed)
Jeanne Waller   Subjective: Post Partum Day 1 Vaginal delivery, 2 degree perineal laceration Patient up ad lib, denies syncope or dizziness. Reports consuming regular diet without issues and denies N/V No issues with urination and reports bleeding is appropriate  Feeding:  breast Contraceptive plan:   BTL  Objective: Temp:  [97.9 F (36.6 C)-98.4 F (36.9 C)] 98.4 F (36.9 C) (12/12 0625) Pulse Rate:  [88-105] 98 (12/12 0625) Resp:  [13-20] 20 (12/12 0625) BP: (90-130)/(63-86) 117/68 mmHg (12/12 0625) SpO2:  [95 %-97 %] 97 % (12/11 1300)  Physical Exam:  General: alert and cooperative Ext: WNL, no significant  edema. No evidence of DVT seen on physical exam. Breast: Soft filling Lungs: CTAB Heart RRR without murmur  Abdomen:  Soft, fundus firm, lochia scant, + bowel sounds, non distended, non tender Lochia: appropriate Uterine Fundus: firm Laceration: healing well, no significant drainage    Recent Labs  02/17/15 0517 02/18/15 0600  HGB 11.6* 9.6*  HCT 34.8* 29.3*    Assessment S/P Vaginal Delivery-Day 1, w/BTL Stable  Normal Involution Breastfeeding anemia   Plan: Continue current care Plan for discharge tomorrow and Breastfeeding Lactation support   Tauna Macfarlane, [name], MSN 02/18/2015, 8:55 AM

## 2015-02-18 NOTE — Lactation Note (Signed)
This note was copied from the chart of Jeanne Alanah Hanback. Lactation Consultation Note  Formula initiated because [employer] was eating consistently and not getting satisfied. RN reports uric acid crystals in diaper. Mom is post-pumping for 15 minutes after BF.  Patient Name: Jeanne Waller [id number] Date: 02/18/2015     Maternal Data    Feeding Feeding Type: Formula Length of feed: 15 min  LATCH Score/Interventions                      Lactation Tools Discussed/Used     Consult Status      Van Clines 02/18/2015, 3:33 PM

## 2015-02-18 NOTE — Anesthesia Postprocedure Evaluation (Signed)
Anesthesia Post Note  Patient: Jeanne Waller  Procedure(s) Performed: Procedure(s) (LRB): POST PARTUM TUBAL LIGATION (Bilateral)  Patient location during evaluation: Mother Baby Anesthesia Type: Epidural Level of consciousness: awake Pain management: satisfactory to patient Vital Signs Assessment: post-procedure vital signs reviewed and stable Respiratory status: spontaneous breathing Cardiovascular status: stable Anesthetic complications: no    Last Vitals:  Filed Vitals:   02/18/15 0220 02/18/15 0625  BP: 90/67 117/68  Pulse: 93 98  Temp: 36.8 C 36.9 C  Resp: 18 20    Last Pain:  Filed Vitals:   02/18/15 0659  PainSc: 2                  Zalea Pete

## 2015-02-19 DIAGNOSIS — Z9851 Tubal ligation status: Secondary | ICD-10-CM

## 2015-02-19 MED ORDER — OXYCODONE-ACETAMINOPHEN 5-325 MG PO TABS: 1.0000 | ORAL_TABLET | ORAL | Status: AC | PRN

## 2015-02-19 MED ORDER — IBUPROFEN 600 MG PO TABS: 600.0000 mg | ORAL_TABLET | Freq: Four times a day (QID) | ORAL | Status: DC | PRN

## 2015-02-19 MED ORDER — MEASLES, MUMPS & RUBELLA VAC ~~LOC~~ INJ
0.5000 mL | INJECTION | Freq: Once | SUBCUTANEOUS | Status: AC
  Administered 2015-02-19: 0.5 mL via SUBCUTANEOUS
  Filled 2015-02-19: qty 0.5

## 2015-02-19 NOTE — Progress Notes (Deleted)
Jeanne Waller is a40 y.o.  [id number]  Post Op Date # 1:  Attempted Laparoscopy with Lysis of Adhesions/Abdominal Hysterectomy/Bilateral Salpingectomy  Subjective: Patient is Doing well postoperatively. Patient has Pain is controlled with current analgesics. Medications being used: prescription NSAID's including Ketorolac 30 mg and narcotic analgesics including PCA Hydromorphone. Ambulating in the halls without dizziness, tolerating liquids and crackers, hasn't voided yet as Foley removed approximately 30 minutes ago.   Objective: Vital signs in last 24 hours: Temp:  [97.9 F (36.6 C)-98 F (36.7 C)] 98 F (36.7 C) (12/13 0552) Pulse Rate:  [86-95] 86 (12/13 0552) Resp:  [18] 18 (12/13 0552) BP: (109-129)/(67-71) 109/67 mmHg (12/13 0552)  Intake/Output from previous day:   Intake/Output this shift:    Recent Labs Lab 02/16/15 0535 02/17/15 0517 02/18/15 0600  WBC 8.1 13.4* 9.1  HGB 12.0 11.6* 9.6*  HCT 36.0 34.8* 29.3*  PLT 170 159 155    No results for input(s): NA, K, CL, CO2, BUN, CREATININE, CALCIUM, PROT, BILITOT, ALKPHOS, ALT, AST, GLUCOSE in the last 168 hours.  Invalid input(s): LABALBU  EXAM: General: alert, cooperative and no distress Resp: clear to auscultation bilaterally Cardio: regular rate and rhythm, S1, S2 normal, no murmur, click, rub or gallop GI: soft, non-tender; bowel sounds normal; no masses,  no organomegaly Extremities: Homans sign is negative, no sign of DVT and no calf tenderness.   Assessment: s/p Procedure(s): POST PARTUM TUBAL LIGATION: stable, progressing well and anemia  Plan: Advance diet Encourage ambulation Advance to PO medication Routine Care  LOS: 3 days    Parnika Tweten, PA-C 02/19/2015 6:50 AM

## 2015-02-19 NOTE — Discharge Summary (Signed)
OB Discharge Summary     Patient Name: Jeanne Waller DOB: 1980-03-18 MRN: [id number]  Date of admission: 02/16/2015 Delivering MD: [name]   Date of discharge: 02/19/2015  Admitting diagnosis: PREG desires sterilization desires sterilization Intrauterine pregnancy: [redacted]w[redacted]Waller     Secondary diagnosis:  Principal Problem:   Spontaneous vaginal delivery Active Problems:   Bipolar disorder ([place])   Fibroid   AMA (advanced maternal age) multigravida 35+   Positive GBS test   Rubella non-immune status, antepartum   PROM (premature rupture of membranes)   Normal labor   S/P tubal ligation  Additional problems: None     Discharge diagnosis: Term Pregnancy Delivered and BTL                                                                                                Post partum procedures:postpartum tubal ligation  Augmentation: Pitocin  Complications: None  Hospital course:  Onset of Labor With Vaginal Delivery     34 y.o. yo G3P2011 at [redacted]w[redacted]Waller was admitted in Latent Labor, with SROM, on 02/16/2015. Patient had an uncomplicated labor course as follows:  Membrane Rupture Time/Date: 1:00 AM ,02/16/2015   Intrapartum Procedures: Episiotomy: None [1]                                         Lacerations:  1st degree [2];Labial [10];2nd degree [3];Perineal [11]  Patient had a delivery of a Viable infant. 02/17/2015  Information for the patient's newborn:  Jeanne Waller Girl Jeanne [id number]  Delivery Method: Vaginal, Spontaneous Delivery (Filed from Delivery Summary)    Pateint had an uncomplicated postpartum course.  She is ambulating, tolerating a regular diet, passing flatus, and urinating well. Patient is discharged home in stable condition on 02/19/15.   Physical exam  Filed Vitals:   02/18/15 0220 02/18/15 0625 02/18/15 1857 02/19/15 0552  BP: 90/67 117/68 129/71 109/67  Pulse: 93 98 95 86  Temp: 98.3 F (36.8 C) 98.4 F (36.9 C) 97.9 F (36.6 C) 98 F (36.7 C)   TempSrc: Oral Oral Oral Oral  Resp: 18 20 18 18   Height:      Weight:      SpO2:       General: alert Lochia: appropriate Uterine Fundus: firm Incision: Dressing is clean, dry, and intact from BTL; 2nd degree perineal and bilateral labial lacerations healing well. DVT Evaluation: No evidence of DVT seen on physical exam. Negative Homan's sign.  Labs: CBC Latest Ref Rng 02/18/2015 02/17/2015 02/16/2015  WBC 4.0 - 10.5 K/uL 9.1 13.4(H) 8.1  Hemoglobin 12.0 - 15.0 g/dL 9.6(L) 11.6(L) 12.0  Hematocrit 36.0 - 46.0 % 29.3(L) 34.8(L) 36.0  Platelets 150 - 400 K/uL 155 159 170     Discharge instruction: per After Visit Summary and "Baby and Me Booklet".  After visit meds:    Medication List    TAKE these medications        ibuprofen 600 MG tablet  Commonly known as:  ADVIL,MOTRIN  Take 1 tablet (  600 mg total) by mouth every 6 (six) hours as needed.     lamoTRIgine 200 MG tablet  Commonly known as:  LAMICTAL  Take 400 mg by mouth daily.     oxyCODONE-acetaminophen 5-325 MG tablet  Commonly known as:  PERCOCET/ROXICET  Take 1 tablet by mouth every 4 (four) hours as needed (for pain scale 4-7).     QUEtiapine 200 MG tablet  Commonly known as:  SEROQUEL  Take 200 mg by mouth at bedtime.        Diet: routine diet  Activity: Advance as tolerated. Pelvic rest for 6 weeks.   Outpatient follow up:6 weeks Follow up Appt:No future appointments. Follow up Visit:No Follow-up on file.  Postpartum contraception: Tubal Ligation done 02/17/15 by Dr. Alesia Richards  Newborn Data: Live born female  Birth Weight: 8 lb 4 oz (3742 g) APGAR: 2, 9  Baby Feeding: Breast Disposition:home with mother   02/19/2015 Jeanne Waller, [name]

## 2015-02-19 NOTE — Discharge Instructions (Signed)
Postpartum Care After Vaginal Delivery °After you deliver your newborn (postpartum period), the usual stay in the hospital is 24-72 hours. If there were problems with your labor or delivery, or if you have other medical problems, you might be in the hospital longer.  °While you are in the hospital, you will receive help and instructions on how to care for yourself and your newborn during the postpartum period.  °While you are in the hospital: °· Be sure to tell your nurses if you have pain or discomfort, as well as where you feel the pain and what makes the pain worse. °· If you had an incision made near your vagina (episiotomy) or if you had some tearing during delivery, the nurses may put ice packs on your episiotomy or tear. The ice packs may help to reduce the pain and swelling. °· If you are breastfeeding, you may feel uncomfortable contractions of your uterus for a couple of weeks. This is normal. The contractions help your uterus get back to normal size. °· It is normal to have some bleeding after delivery. °· For the first 1-3 days after delivery, the flow is red and the amount may be similar to a period. °· It is common for the flow to start and stop. °· In the first few days, you may pass some small clots. Let your nurses know if you begin to pass large clots or your flow increases. °· Do not  flush blood clots down the toilet before having the nurse look at them. °· During the next 3-10 days after delivery, your flow should become more watery and pink or brown-tinged in color. °· Ten to fourteen days after delivery, your flow should be a small amount of yellowish-white discharge. °· The amount of your flow will decrease over the first few weeks after delivery. Your flow may stop in 6-8 weeks. Most women have had their flow stop by 12 weeks after delivery. °· You should change your sanitary pads frequently. °· Wash your hands thoroughly with soap and water for at least 20 seconds after changing pads, using  the toilet, or before holding or feeding your newborn. °· You should feel like you need to empty your bladder within the first 6-8 hours after delivery. °· In case you become weak, lightheaded, or faint, call your nurse before you get out of bed for the first time and before you take a shower for the first time. °· Within the first few days after delivery, your breasts may begin to feel tender and full. This is called engorgement. Breast tenderness usually goes away within 48-72 hours after engorgement occurs. You may also notice milk leaking from your breasts. If you are not breastfeeding, do not stimulate your breasts. Breast stimulation can make your breasts produce more milk. °· Spending as much time as possible with your newborn is very important. During this time, you and your newborn can feel close and get to know each other. Having your newborn stay in your room (rooming in) will help to strengthen the bond with your newborn.  It will give you time to get to know your newborn and become comfortable caring for your newborn. °· Your hormones change after delivery. Sometimes the hormone changes can temporarily cause you to feel sad or tearful. These feelings should not last more than a few days. If these feelings last longer than that, you should talk to your caregiver. °· If desired, talk to your caregiver about methods of family planning or contraception. °·   Talk to your caregiver about immunizations. Your caregiver may want you to have the following immunizations before leaving the hospital:  Tetanus, diphtheria, and pertussis (Tdap) or tetanus and diphtheria (Td) immunization. It is very important that you and your family (including grandparents) or others caring for your newborn are up-to-date with the Tdap or Td immunizations. The Tdap or Td immunization can help protect your newborn from getting ill.  Rubella immunization.  Varicella (chickenpox) immunization.  Influenza immunization. You should  receive this annual immunization if you did not receive the immunization during your pregnancy.   This information is not intended to replace advice given to you by your health care provider. Make sure you discuss any questions you have with your health care provider.   Document Released: 12/21/2006 Document Revised: 11/18/2011 Document Reviewed: 10/21/2011 Elsevier Interactive Patient Education 2016 [place].  Postpartum Tubal Ligation, Care After Refer to this sheet in the next few weeks. These instructions provide you with information about caring for yourself after your procedure. Your health care provider may also give you more specific instructions. Your treatment has been planned according to current medical practices, but problems sometimes occur. Call your health care provider if you have any problems or questions after your procedure. WHAT TO EXPECT AFTER THE PROCEDURE After your procedure, it is common to have:  Sore throat.  Soreness at the incision site.  Mild cramping.  Tiredness.  Mild nausea or vomiting. HOME CARE INSTRUCTIONS  Rest for the remainder of the day.  Take medicines only as directed by your health care provider. These include over-the-counter medicines and prescription medicines. Do not take aspirin, which can cause bleeding.  Over the next few days, gradually return to your normal activities and your normal diet.  Avoid sexual intercourse for 2 weeks or as directed by your health care provider.  Do not drive or operate heavy machinery while taking pain medicine.  Do not lift anything that is heavier than 5 lb (2.3 kg) for 2 weeks or as directed by your health care provider.  Do not take baths. Take showers only. Ask your health care provider when you can start taking baths.  Take your temperature twice each day and write it down.  Try to have help for the first 7-10 days for your household needs.  There are many different ways to close and  cover an incision, including stitches (sutures), skin glue, and adhesive strips. Follow instructions from your health care provider about:  Incision care.  Bandage (dressing) changes and removal.  Incision closure removal.  Check your incision area every day for signs of infection. Watch for:  Redness, swelling, or pain.  Fluid, blood, or pus.  Keep all follow-up visits as directed by your health care provider. SEEK MEDICAL CARE IF:  You have redness, swelling, or increasing pain in your incision area.  You have fluid or pus coming from your incision for longer than 1 day.  You notice a bad smell coming from your incision or your dressing.  The edges of your incision break open after the sutures have been removed.  Your pain does not decrease after 2-3 days.  You have a rash.  You repeatedly become dizzy or light-headed.  You have a reaction to your medicine.  Your pain medicine is not helping.  You are constipated. SEEK IMMEDIATE MEDICAL CARE IF:   You have a fever.  You faint.  You have increasing pain in your abdomen.  You have bleeding or drainage from your  suture sites or your vagina after surgery.  You have shortness of breath or have difficulty breathing.  You have chest pain or leg pain.  You have ongoing nausea, vomiting, or diarrhea.   This information is not intended to replace advice given to you by your health care provider. Make sure you discuss any questions you have with your health care provider.   Document Released: 08/25/2011 Document Revised: 07/10/2014 Document Reviewed: 08/25/2011 Elsevier Interactive Patient Education [employer].

## 2015-02-19 NOTE — Lactation Note (Signed)
This note was copied from the chart of Jeanne Waller. Lactation Consultation Note: Follow up visit before DC. Mom reports baby has had some formula through the night because she wasn't making enough milk and Willow was acting so hungry after feedings. Willow just holding breast in her mouth. Suggested unwrapping and undressing baby to awaken more. Encouraged breast massage and hand expression. [place] latched well- mom reports no pain with latch. Some swallows noted. [place] on the second breast when I left room. Mom reports breasts are feeling a litle fuller this morning. Plans to get pump from employer for home. Reviewed using pump pieces as manual pump until she can get DEBP. Discussed engorgement prevention and treatment. Encouraged to always nurse on both breasts before giving formula. No questions at present. TO call prn Patient Name: Jeanne Waller [id number] Date: 02/19/2015 Reason for consult: Follow-up assessment   Maternal Data Formula Feeding for Exclusion: No Has patient been taught Hand Expression?: Yes Does the patient have breastfeeding experience prior to this delivery?: Yes  Feeding Feeding Type: Breast Fed Nipple Type: Slow - flow Length of feed: 10 min  LATCH Score/Interventions Latch: Grasps breast easily, tongue down, lips flanged, rhythmical sucking.  Audible Swallowing: A few with stimulation  Type of Nipple: Everted at rest and after stimulation  Comfort (Breast/Nipple): Soft / non-tender     Hold (Positioning): No assistance needed to correctly position infant at breast.  LATCH Score: 9  Lactation Tools Discussed/Used [facility] Program: No   Consult Status Consult Status: Complete    Truddie Crumble 02/19/2015, 8:42 AM

## 2016-01-09 ENCOUNTER — Encounter: Payer: Self-pay | Admitting: Family Medicine

## 2016-01-09 DIAGNOSIS — Z975 Presence of (intrauterine) contraceptive device: Secondary | ICD-10-CM | POA: Insufficient documentation

## 2016-02-19 LAB — HM PAP SMEAR

## 2016-02-20 ENCOUNTER — Encounter: Payer: Self-pay | Admitting: Family Medicine

## 2017-05-25 ENCOUNTER — Ambulatory Visit: Admit: 2017-05-25 | Payer: PRIVATE HEALTH INSURANCE | Admitting: Obstetrics and Gynecology

## 2017-05-25 SURGERY — HYSTERECTOMY, VAGINAL, LAPAROSCOPY-ASSISTED
Anesthesia: General | Laterality: Bilateral

## 2017-06-07 HISTORY — PX: TOTAL LAPAROSCOPIC HYSTERECTOMY WITH SALPINGECTOMY: SHX6742

## 2017-07-06 ENCOUNTER — Encounter: Payer: Self-pay | Admitting: Family Medicine

## 2018-12-22 ENCOUNTER — Other Ambulatory Visit: Payer: Self-pay

## 2018-12-22 DIAGNOSIS — Z20822 Contact with and (suspected) exposure to covid-19: Secondary | ICD-10-CM

## 2018-12-23 LAB — NOVEL CORONAVIRUS, NAA: SARS-CoV-2, NAA: DETECTED — AB

## 2018-12-26 ENCOUNTER — Ambulatory Visit: Payer: Self-pay

## 2018-12-26 NOTE — Telephone Encounter (Signed)
Provided covid-19  lab results to Patient and provided care advice related to covid-19 Pt voices understanding.

## 2019-01-20 ENCOUNTER — Other Ambulatory Visit: Payer: Self-pay

## 2019-01-20 DIAGNOSIS — Z20822 Contact with and (suspected) exposure to covid-19: Secondary | ICD-10-CM

## 2019-01-23 LAB — NOVEL CORONAVIRUS, NAA: SARS-CoV-2, NAA: NOT DETECTED

## 2022-08-16 ENCOUNTER — Encounter (HOSPITAL_BASED_OUTPATIENT_CLINIC_OR_DEPARTMENT_OTHER): Payer: Self-pay | Admitting: Emergency Medicine

## 2022-08-16 ENCOUNTER — Emergency Department (HOSPITAL_BASED_OUTPATIENT_CLINIC_OR_DEPARTMENT_OTHER)
Admission: EM | Admit: 2022-08-16 | Discharge: 2022-08-16 | Disposition: A | Discharge status: Home / Self Care | Payer: BC Managed Care – PPO | Attending: Emergency Medicine | Admitting: Emergency Medicine

## 2022-08-16 DIAGNOSIS — R21 Rash and other nonspecific skin eruption: Secondary | ICD-10-CM | POA: Insufficient documentation

## 2022-08-16 DIAGNOSIS — I10 Essential (primary) hypertension: Secondary | ICD-10-CM | POA: Insufficient documentation

## 2022-08-16 MED ORDER — PREDNISONE 10 MG PO TABS: ORAL_TABLET | ORAL | 0 refills | Status: AC

## 2022-08-16 NOTE — Discharge Instructions (Addendum)
Follow-up with your primary care doctor and potentially dermatology.  Your blood pressure was elevated and needs to be followed.  Take the steroids and finish up the antibiotics.  Cetirizine may help with itching.

## 2022-08-16 NOTE — ED Provider Notes (Signed)
[place] EMERGENCY DEPARTMENT AT [facility] Provider Note   CSN: [id number] Arrival date & time: 08/16/22  1928     History  Chief Complaint  Patient presents with   Rash    Jeanne Waller is a 42 y.o. female.   Rash  Patient presents with rash.  Has had for a couple weeks now.  Itchy.  Red.  Had seen urgent care and was given Keflex and steroid taper.  Had been given 40 mg 5 days and then tapering down to 20 and then 10.  Had improved but now worsened. Initial lesions have improved and returned some but still has new lesions.  On arms somewhat on chest and back.  Not on legs or feet.  No fevers.  Is on Lamictal but no recent change.  Does have hypertension blood pressure is elevated here.   Past Medical History:  Diagnosis Date   Bipolar 1 disorder (HCC)    Bipolar disorder (HCC)    Endometriosis 2015   s/p ex lap with endometrioma removal   History of chicken pox    PUD (peptic ulcer disease)    controlled with nexium in past    Home Medications Prior to Admission medications   Medication Sig Start Date End Date Taking? Authorizing Provider  predniSONE (DELTASONE) 10 MG tablet Take 4 tablets (40 mg total) by mouth daily for 4 days, THEN 3 tablets (30 mg total) daily for 4 days, THEN 2 tablets (20 mg total) daily for 4 days, THEN 1 tablet (10 mg total) daily for 4 days. 08/16/22 09/01/22 Yes Benjiman Core, MD  ibuprofen (ADVIL,MOTRIN) 600 MG tablet Take 1 tablet (600 mg total) by mouth every 6 (six) hours as needed. 02/19/15   Nigel Bridgeman, [name]  lamoTRIgine (LAMICTAL) 200 MG tablet Take 400 mg by mouth daily.     [provider]  oxyCODONE-acetaminophen (PERCOCET/ROXICET) 5-325 MG tablet Take 1 tablet by mouth every 4 (four) hours as needed (for pain scale 4-7). 02/19/15   Nigel Bridgeman, [name]  QUEtiapine (SEROQUEL) 200 MG tablet Take 200 mg by mouth at bedtime.    [provider]      Allergies    Patient has no known allergies.    Review  of Systems   Review of Systems  Skin:  Positive for rash.    Physical Exam Updated Vital Signs BP (!) 173/106 (BP Location: Right Arm)   Pulse 95   Temp 98.1 F (36.7 C) (Oral)   Resp 18   LMP 09/12/2013   SpO2 100%  Physical Exam Vitals reviewed.  Cardiovascular:     Rate and Rhythm: Regular rhythm.  Musculoskeletal:     Cervical back: Neck supple.  Skin:    Comments: Multiple different areas.  On her back couple raised areas with some erythema.  Some have been scratched.  Also some areas on the arm that have a darker center with some mild surrounding erythema.  Not raised.  Not blistering.  No hand lesions.  No mouth lesions.  Few lesions on forearms and upper arms.     ED Results / Procedures / Treatments   Labs (all labs ordered are listed, but only abnormal results are displayed) Labs Reviewed - No data to display  EKG None  Radiology No results found.  Procedures Procedures    Medications Ordered in ED Medications - No data to display  ED Course/ Medical Decision Making/ A&P  Medical Decision Making Risk Prescription drug management.    patient with rash.  Nonspecific.  Had been on steroids with some improvement.  Will restart at the higher dose and will give a taper.  Will continue antibiotics.  Will need follow-up with PCP or potentially dermatology.  Doubt Lamictal as the cause of the rash.  No mucous membrane involvement.  No known insect or tick bites but still potential cause.  Will discharge home.        Final Clinical Impression(s) / ED Diagnoses Final diagnoses:  Rash  Hypertension, unspecified type    Rx / DC Orders ED Discharge Orders          Ordered    predniSONE (DELTASONE) 10 MG tablet  Daily        08/16/22 2025              Benjiman Core, MD 08/16/22 2344

## 2022-08-16 NOTE — ED Triage Notes (Signed)
"  I think I was bit by something" Scattered areas of itchiness and scratching on arms and neck Notice 1 hour ago

## 2023-09-06 ENCOUNTER — Ambulatory Visit
Admission: EM | Admit: 2023-09-06 | Discharge: 2023-09-06 | Disposition: A | Discharge status: Home / Self Care | Attending: Family Medicine | Admitting: Family Medicine

## 2023-09-06 DIAGNOSIS — M5431 Sciatica, right side: Secondary | ICD-10-CM | POA: Diagnosis not present

## 2023-09-06 MED ORDER — CYCLOBENZAPRINE HCL 5 MG PO TABS: 5.0000 mg | ORAL_TABLET | Freq: Every evening | ORAL | 0 refills | Status: DC | PRN

## 2023-09-06 MED ORDER — PREDNISONE 20 MG PO TABS: ORAL_TABLET | ORAL | 0 refills | Status: AC

## 2023-09-06 NOTE — ED Provider Notes (Signed)
 [facility]  Note:  This document was prepared using Conservation officer, historic buildings and may include unintentional dictation errors.  MRN: [id number] DOB: [birth year]  Subjective:   Jeanne Waller is a 43 y.o. female presenting for 1 week history of persistent and worsening moderate to severe right-sided low back pain that radiates into the buttock and all the way into her foot.  Has associated numbness and tingling worse when she has been sitting for prolonged periods of time.  No history of musculoskeletal disorders or injuries to the low back.  No changes to bowel or urinary habits.  No rashes.  Patient has been using ibuprofen  without any relief.  Has previously taken prednisone  for other diagnoses and has done well with it.  No current facility-administered medications for this encounter.  Current Outpatient Medications:    ALPRAZolam (XANAX) 1 MG tablet, Take 1 tablet by mouth 2 (two) times daily as needed., Disp: , Rfl:    carbamazepine (TEGRETOL) 200 MG tablet, Take 600 mg by mouth., Disp: , Rfl:    cyclobenzaprine  (FLEXERIL ) 10 MG tablet, Take 10 mg by mouth., Disp: , Rfl:    losartan-hydrochlorothiazide (HYZAAR) 100-12.5 MG tablet, Take 1 tablet by mouth daily., Disp: , Rfl:    ibuprofen  (ADVIL ,MOTRIN ) 600 MG tablet, Take 1 tablet (600 mg total) by mouth every 6 (six) hours as needed., Disp: 30 tablet, Rfl: 2   lamoTRIgine  (LAMICTAL ) 200 MG tablet, Take 400 mg by mouth daily. , Disp: , Rfl:    oxyCODONE -acetaminophen  (PERCOCET/ROXICET) 5-325 MG tablet, Take 1 tablet by mouth every 4 (four) hours as needed (for pain scale 4-7)., Disp: 30 tablet, Rfl: 0   QUEtiapine  (SEROQUEL ) 200 MG tablet, Take 200 mg by mouth at bedtime., Disp: , Rfl:    Vitamin D, Ergocalciferol, (DRISDOL) 1.25 MG (50000 UNIT) CAPS capsule, Take 50,000 Units by mouth once a week., Disp: , Rfl:    Allergies  Allergen Reactions   Lisinopril Anaphylaxis    Lip swelling    Past Medical  History:  Diagnosis Date   Bipolar 1 disorder (HCC)    Bipolar disorder (HCC)    Endometriosis 2015   s/p ex lap with endometrioma removal   History of chicken pox    PUD (peptic ulcer disease)    controlled with nexium in past     Past Surgical History:  Procedure Laterality Date   COLPOSCOPY     abnormal pap - positive HPV   DIAGNOSTIC LAPAROSCOPY  09/2013   for chronic pelvic pain and uterine fibroids   INDUCED ABORTION  2002   LAPAROSCOPIC LYSIS OF ADHESIONS N/A 10/05/2013   Procedure: LAPAROSCOPIC LYSIS OF ADHESIONS;  Surgeon: Shanda SHAUNNA Muscat, MD;  Location: [facility];  Service: Gynecology;  Laterality: N/A;   LAPAROSCOPY N/A 10/05/2013   Procedure: LAPAROSCOPY OPERATIVE;  Surgeon: Shanda SHAUNNA Muscat, MD;  Location: [facility];  Service: Gynecology;  Laterality: N/A;  Please have Harmonic scalpel available   TOTAL LAPAROSCOPIC HYSTERECTOMY WITH SALPINGECTOMY  06/2017   Dr Genette Essex at [facility]   TUBAL LIGATION Bilateral 02/17/2015   Procedure: POST PARTUM TUBAL LIGATION;  Surgeon: Jerolyn Foil, MD;  Location: [facility];  Service: Gynecology;  Laterality: Bilateral;    Family History  Problem Relation Age of Onset   Hypertension Sister    Diabetes Maternal Aunt    Heart disease Maternal Uncle    Diabetes Maternal Grandmother    Cancer Maternal Grandmother        cervical cancer  Heart disease Maternal Grandmother    Stroke Neg Hx    Coronary artery disease Neg Hx     Social History   Tobacco Use   Smoking status: Former    Current packs/day: 0.00    Types: Cigarettes    Quit date: 03/10/2007    Years since quitting: 16.5   Smokeless tobacco: Never  Vaping Use   Vaping status: Never Used  Substance Use Topics   Alcohol use: Yes    Comment: rare   Drug use: No    ROS   Objective:   Vitals: BP (!) 149/99 (BP Location: Right Arm)   Pulse 70   Temp 98.2 F (36.8 C) (Oral)   Resp 16   LMP 09/12/2013   SpO2 96%   Physical Exam Constitutional:      General:  She is not in acute distress.    Appearance: Normal appearance. She is well-developed. She is not ill-appearing, toxic-appearing or diaphoretic.  HENT:     Head: Normocephalic and atraumatic.     Nose: Nose normal.     Mouth/Throat:     Mouth: Mucous membranes are moist.   Eyes:     General: No scleral icterus.       Right eye: No discharge.        Left eye: No discharge.     Extraocular Movements: Extraocular movements intact.    Cardiovascular:     Rate and Rhythm: Normal rate.  Pulmonary:     Effort: Pulmonary effort is normal.   Musculoskeletal:     Lumbar back: Spasms and tenderness present. No swelling, edema, deformity, signs of trauma, lacerations or bony tenderness. Normal range of motion. Positive right straight leg raise test. Negative left straight leg raise test. No scoliosis.   Skin:    General: Skin is warm and dry.   Neurological:     General: No focal deficit present.     Mental Status: She is alert and oriented to person, place, and time.   Psychiatric:        Mood and Affect: Mood normal.        Behavior: Behavior normal.     Assessment and Plan :   PDMP not reviewed this encounter.  1. Sciatica, right side    Recommended oral prednisone  course, muscle relaxant.  Patient is to practice good back care and follow-up with the spine clinic.  Deferred imaging as it is of low yield for sciatica.  Counseled patient on potential for adverse effects with medications prescribed/recommended today, ER and return-to-clinic precautions discussed, patient verbalized understanding.    Christopher Savannah, [place] 09/06/23 1325

## 2023-09-06 NOTE — ED Triage Notes (Signed)
 Pt c/o right sciatic pain x 1 week-pain from right buttocks down RLE-no relief with ibuprofen  and icy hot-NAD-steady gait

## 2023-09-13 ENCOUNTER — Emergency Department (HOSPITAL_BASED_OUTPATIENT_CLINIC_OR_DEPARTMENT_OTHER): Admitting: Radiology

## 2023-09-13 ENCOUNTER — Emergency Department (HOSPITAL_BASED_OUTPATIENT_CLINIC_OR_DEPARTMENT_OTHER)
Admission: EM | Admit: 2023-09-13 | Discharge: 2023-09-13 | Disposition: A | Discharge status: Home / Self Care | Attending: Emergency Medicine | Admitting: Emergency Medicine

## 2023-09-13 ENCOUNTER — Other Ambulatory Visit: Payer: Self-pay

## 2023-09-13 ENCOUNTER — Encounter (HOSPITAL_BASED_OUTPATIENT_CLINIC_OR_DEPARTMENT_OTHER): Payer: Self-pay

## 2023-09-13 DIAGNOSIS — M5441 Lumbago with sciatica, right side: Secondary | ICD-10-CM | POA: Insufficient documentation

## 2023-09-13 DIAGNOSIS — M545 Low back pain, unspecified: Secondary | ICD-10-CM

## 2023-09-13 MED ORDER — HYDROCODONE-ACETAMINOPHEN 5-325 MG PO TABS
1.0000 | ORAL_TABLET | Freq: Once | ORAL | Status: AC
  Administered 2023-09-13: 1 via ORAL
  Filled 2023-09-13: qty 1

## 2023-09-13 MED ORDER — LIDOCAINE 5 % EX PTCH
1.0000 | MEDICATED_PATCH | CUTANEOUS | Status: DC
Start: 2023-09-13 — End: 2023-09-13
  Administered 2023-09-13: 1 via TRANSDERMAL
  Filled 2023-09-13: qty 1

## 2023-09-13 NOTE — ED Triage Notes (Signed)
 Pt c/o sciatic pain, numb on R side x1.26mos, denies hx of same. Follow up appt w neuro at the end of the month but I just can't take it. Denies incontinence, ambulatory w steady gait  Muscle relaxer, ibuprofen  & prednisone  w no relief.

## 2023-09-13 NOTE — Discharge Instructions (Addendum)
 Evaluation today was overall reassuring.  Please follow-up with neurosurgery at your scheduled appointment.  If you develop saddle numbness, urinary or bowel incontinence or retention, develop a fever or have any new trauma to your back or any other concerning symptom please return to the ED for further evaluation.

## 2023-09-13 NOTE — ED Provider Notes (Signed)
 [facility] EMERGENCY DEPARTMENT AT [facility] Provider Note   CSN: [id number] Arrival date & time: 09/13/23  1117     Patient presents with: Sciatica  HPI Jeanne Waller is a 43 y.o. female presenting for right lower back pain.  She states has been going on for 1-1/2 months.  The pain originates in the right upper buttock and does radiate down to the right foot at times.  It feels sharp.  She also reports some numbness has been going on for about a month as well in the right leg.  Still ambulatory and bearing weight with no issue.  Denies any pain about the midline of the back.  Denies saddle anesthesia, IV drug use, fever, urinary or bowel changes.  She reports that she does have an appointment with a neurosurgeon at the end of this month but the pain and discomfort has been worse prompting her evaluation today.  She states she is using muscle relaxers, ibuprofen  and finished a 7-day course of prednisone .  She reports that she took 800 mg of ibuprofen  around 8 AM this morning.   HPI     Prior to Admission medications   Medication Sig Start Date End Date Taking? Authorizing Provider  ALPRAZolam (XANAX) 1 MG tablet Take 1 tablet by mouth 2 (two) times daily as needed. 01/19/20   [provider]  carbamazepine (TEGRETOL) 200 MG tablet Take 600 mg by mouth. 01/19/20   [provider]  cyclobenzaprine  (FLEXERIL ) 5 MG tablet Take 1 tablet (5 mg total) by mouth at bedtime as needed. 09/06/23   Christopher Savannah, PA-C  ibuprofen  (ADVIL ,MOTRIN ) 600 MG tablet Take 1 tablet (600 mg total) by mouth every 6 (six) hours as needed. 02/19/15   Verta Blossom, [name]  lamoTRIgine  (LAMICTAL ) 200 MG tablet Take 400 mg by mouth daily.     [provider]  losartan-hydrochlorothiazide (HYZAAR) 100-12.5 MG tablet Take 1 tablet by mouth daily. 06/18/23   [provider]  oxyCODONE -acetaminophen  (PERCOCET/ROXICET) 5-325 MG tablet Take 1 tablet by mouth every 4 (four) hours as  needed (for pain scale 4-7). 02/19/15   Verta Blossom, [name]  predniSONE  (DELTASONE ) 20 MG tablet Take 2 tablets daily with breakfast. 09/06/23   Christopher Savannah, PA-C  QUEtiapine  (SEROQUEL ) 200 MG tablet Take 200 mg by mouth at bedtime.    [provider]  Vitamin D, Ergocalciferol, (DRISDOL) 1.25 MG (50000 UNIT) CAPS capsule Take 50,000 Units by mouth once a week.    [provider]    Allergies: Lisinopril    Review of Systems See HPI  Updated Vital Signs BP (!) 163/103   Pulse 76   Temp 98.7 F (37.1 C)   Resp 16   LMP 09/12/2013   SpO2 100%    Physical Exam   Vitals:   09/13/23 1123  BP: (!) 163/103  Pulse: 76  Resp: 16  Temp: 98.7 F (37.1 C)  SpO2: 100%    CONSTITUTIONAL:  well-appearing, NAD NEURO:  GCS 15. Speech is goal oriented. No deficits appreciated to CN III-XII; symmetric eyebrow raise, no facial drooping, tongue midline. Patient has equal grip strength bilaterally with 5/5 strength against resistance in all major muscle groups bilaterally. Sensation to light touch intact. Patient moves extremities without ataxia. Normal finger-nose-finger. Patient ambulatory with steady gait. EYES:  eyes equal and reactive ENT/NECK:  Supple, no stridor  CARDIO:  regular rate and rhythm, appears well-perfused  PULM:  No respiratory distress, CTAB GI/GU:  non-distended, soft, non tender MSK/SPINE:  No gross deformities, no edema, moves all extremities.  Tenderness with palpation over the right sciatic notch.  SKIN:  no rash, atraumatic  *Additional and/or pertinent findings included in MDM below   (all labs ordered are listed, but only abnormal results are displayed) Labs Reviewed - No data to display  EKG: None  Radiology: DG Lumbar Spine Complete Result Date: 09/13/2023 CLINICAL DATA:  Back pain. EXAM: LUMBAR SPINE - COMPLETE 4+ VIEW COMPARISON:  None Available. FINDINGS: Five lumbar type vertebra. There is no acute fracture or subluxation of the lumbar  spine. Mild degenerative changes. The visualized posterior elements are intact. The soft tissues are unremarkable. IMPRESSION: 1. No acute findings. 2. Mild degenerative changes. Electronically Signed   By: Vanetta Chou M.D.   On: 09/13/2023 12:09     Procedures   Medications Ordered in the ED  HYDROcodone -acetaminophen  (NORCO/VICODIN) 5-325 MG per tablet 1 tablet (has no administration in time range)  lidocaine  (LIDODERM ) 5 % 1 patch (has no administration in time range)                                    Medical Decision Making Amount and/or Complexity of Data Reviewed Radiology: ordered.   43 year old well-appearing female presenting for right lower back pain.  Exam notable for tenderness with palpation overlying the right sciatic notch.  Suspect this is sciatica.  Also considered cauda equina syndrome or spinal infection but unlikely given lack of red flag symptoms.  Also considered stroke but unlikely given that her symptoms have been going on for 1-1/2 months and no FND on exam.  Also considered renal pathology or UTI but unlikely given lack of urinary symptoms and no CVA tenderness.  Patient does have appropriate follow-up with neurosurgery.  Advised to reach out to the clinic to see if they might possibly be able to see her sooner than later.  Discussed return precautions.  Advised to continue conservative treatment at home.  Discharged good condition.     Final diagnoses:  Low back pain, unspecified back pain laterality, unspecified chronicity, unspecified whether sciatica present    ED Discharge Orders     None          Lang Norleen POUR, PA-C 09/13/23 1434    Dreama Longs, MD 09/13/23 2326

## 2023-11-03 ENCOUNTER — Ambulatory Visit

## 2023-11-03 ENCOUNTER — Ambulatory Visit: Payer: Self-pay | Admitting: Nurse Practitioner

## 2023-11-03 ENCOUNTER — Ambulatory Visit
Admission: RE | Admit: 2023-11-03 | Discharge: 2023-11-03 | Disposition: A | Discharge status: Home / Self Care | Source: Ambulatory Visit | Attending: Family Medicine | Admitting: Family Medicine

## 2023-11-03 VITALS — BP 133/91 | HR 80 | Temp 98.0°F | Resp 17

## 2023-11-03 DIAGNOSIS — S93401A Sprain of unspecified ligament of right ankle, initial encounter: Secondary | ICD-10-CM

## 2023-11-03 DIAGNOSIS — S93601A Unspecified sprain of right foot, initial encounter: Secondary | ICD-10-CM

## 2023-11-03 NOTE — Discharge Instructions (Addendum)
 You may elevate and ice the ankle/foot as needed.  You may use the Aircast to the ankle for support and stabilize the joint.  Continue your previously prescribed pain medication as needed.  Please follow-up with your PCP in 1 week if your symptoms do not improve.  Please go to the ER if you develop any worsening symptoms.  Hope you feel better soon!

## 2023-11-03 NOTE — ED Provider Notes (Signed)
 [facility]    CSN: [id number] Arrival date & time: 11/03/23  1023      History   Chief Complaint Chief Complaint  Patient presents with   Ankle Pain    Rolled ankle and wanted X-rays to see if anything is damaged - Entered by patient   Foot Pain    HPI Brandyn L Eichler is a 43 y.o. female presents for foot/ankle pain.  Patient reports she had outpatient low back surgery 6 days ago.  Prior to being discharged she was required to walk several times and during that she rolled her right ankle/foot.  She states she did not have any pain immediately and as she has had some nerve damage secondary to the surgery but over the past few days she has noticed some pain as well as swelling.  Also endorses some bruising to the distal dorsal aspect of the foot.  No weakness.  No history of injury or surgeries to the ankle or foot in the past.  She has been taking her previously prescribed pain medication for symptoms.  No other concerns at this time.   Ankle Pain Foot Pain    Past Medical History:  Diagnosis Date   Bipolar 1 disorder (HCC)    Bipolar disorder (HCC)    Endometriosis 2015   s/p ex lap with endometrioma removal   History of chicken pox    PUD (peptic ulcer disease)    controlled with nexium in past    Patient Active Problem List   Diagnosis Date Noted   IUD (intrauterine device) in place 01/09/2016   S/P tubal ligation 02/19/2015   Spontaneous vaginal delivery 02/17/2015   AMA (advanced maternal age) multigravida 35+ 02/16/2015   Positive GBS test 02/16/2015   Rubella non-immune status, antepartum 02/16/2015   PROM (premature rupture of membranes) 02/16/2015   Normal labor 02/16/2015   Female pelvic pain 10/05/2013   Uterine fibroid 10/05/2013   Endometriosis determined by laparoscopy 10/05/2013   Healthcare maintenance 10/30/2010   Bipolar disorder (HCC)    History of tobacco use 02/14/2007   DYSPEPSIA 08/19/2006    Past Surgical History:  Procedure  Laterality Date   COLPOSCOPY     abnormal pap - positive HPV   DIAGNOSTIC LAPAROSCOPY  09/2013   for chronic pelvic pain and uterine fibroids   INDUCED ABORTION  2002   LAPAROSCOPIC LYSIS OF ADHESIONS N/A 10/05/2013   Procedure: LAPAROSCOPIC LYSIS OF ADHESIONS;  Surgeon: Shanda SHAUNNA Muscat, MD;  Location: [facility];  Service: Gynecology;  Laterality: N/A;   LAPAROSCOPY N/A 10/05/2013   Procedure: LAPAROSCOPY OPERATIVE;  Surgeon: Shanda SHAUNNA Muscat, MD;  Location: [facility];  Service: Gynecology;  Laterality: N/A;  Please have Harmonic scalpel available   TOTAL LAPAROSCOPIC HYSTERECTOMY WITH SALPINGECTOMY  06/2017   Dr Genette Essex at [facility]   TUBAL LIGATION Bilateral 02/17/2015   Procedure: POST PARTUM TUBAL LIGATION;  Surgeon: Jerolyn Foil, MD;  Location: [facility];  Service: Gynecology;  Laterality: Bilateral;    OB History     Gravida  3   Para  2   Term  2   Preterm      AB  1   Living  1      SAB      IAB      Ectopic      Multiple  0   Live Births  1            Home Medications    Prior to Admission  medications   Medication Sig Start Date End Date Taking? Authorizing Provider  ALPRAZolam (XANAX) 1 MG tablet Take 1 tablet by mouth 2 (two) times daily as needed. 01/19/20   [provider]  carbamazepine (TEGRETOL) 200 MG tablet Take 600 mg by mouth. 01/19/20   [provider]  cyclobenzaprine  (FLEXERIL ) 5 MG tablet Take 1 tablet (5 mg total) by mouth at bedtime as needed. 09/06/23   Christopher Savannah, PA-C  ibuprofen  (ADVIL ,MOTRIN ) 600 MG tablet Take 1 tablet (600 mg total) by mouth every 6 (six) hours as needed. 02/19/15   Verta Blossom, [name]  lamoTRIgine  (LAMICTAL ) 200 MG tablet Take 400 mg by mouth daily.     [provider]  losartan-hydrochlorothiazide (HYZAAR) 100-12.5 MG tablet Take 1 tablet by mouth daily. 06/18/23   [provider]  oxyCODONE -acetaminophen  (PERCOCET/ROXICET) 5-325 MG tablet Take 1 tablet by mouth every 4 (four) hours  as needed (for pain scale 4-7). 02/19/15   Verta Blossom, [name]  predniSONE  (DELTASONE ) 20 MG tablet Take 2 tablets daily with breakfast. 09/06/23   Christopher Savannah, PA-C  QUEtiapine  (SEROQUEL ) 200 MG tablet Take 200 mg by mouth at bedtime.    [provider]  Vitamin D, Ergocalciferol, (DRISDOL) 1.25 MG (50000 UNIT) CAPS capsule Take 50,000 Units by mouth once a week.    [provider]    Family History Family History  Problem Relation Age of Onset   Hypertension Sister    Diabetes Maternal Aunt    Heart disease Maternal Uncle    Diabetes Maternal Grandmother    Cancer Maternal Grandmother        cervical cancer   Heart disease Maternal Grandmother    Stroke Neg Hx    Coronary artery disease Neg Hx     Social History Social History   Tobacco Use   Smoking status: Former    Current packs/day: 0.00    Types: Cigarettes    Quit date: 03/10/2007    Years since quitting: 16.6   Smokeless tobacco: Never  Vaping Use   Vaping status: Never Used  Substance Use Topics   Alcohol use: Yes    Comment: rare   Drug use: No     Allergies   Lisinopril   Review of Systems Review of Systems  Musculoskeletal:        Right foot/ankle pain/injury     Physical Exam Triage Vital Signs ED Triage Vitals  Encounter Vitals Group     BP 11/03/23 1042 (!) 133/91     Girls Systolic BP Percentile --      Girls Diastolic BP Percentile --      Boys Systolic BP Percentile --      Boys Diastolic BP Percentile --      Pulse Rate 11/03/23 1042 80     Resp 11/03/23 1042 17     Temp 11/03/23 1042 98 F (36.7 C)     Temp Source 11/03/23 1042 Oral     SpO2 11/03/23 1042 97 %     Weight --      Height --      Head Circumference --      Peak Flow --      Pain Score 11/03/23 1039 5     Pain Loc --      Pain Education --      Exclude from Growth Chart --    No data found.  Updated Vital Signs BP (!) 133/91 (BP Location: Left Arm)   Pulse 80  Temp 98 F (36.7 C) (Oral)    Resp 17   LMP 09/12/2013   SpO2 97%   Visual Acuity Right Eye Distance:   Left Eye Distance:   Bilateral Distance:    Right Eye Near:   Left Eye Near:    Bilateral Near:     Physical Exam Vitals and nursing note reviewed.  Constitutional:      General: She is not in acute distress.    Appearance: Normal appearance. She is not ill-appearing.  HENT:     Head: Normocephalic and atraumatic.  Eyes:     Pupils: Pupils are equal, round, and reactive to light.  Cardiovascular:     Rate and Rhythm: Normal rate.  Pulmonary:     Effort: Pulmonary effort is normal.  Musculoskeletal:       Legs:     Comments: There is moderate swelling without ecchymosis or erythema of the right lateral malleolus with tenderness to palpation distally.  There is some mild bruising to the distal dorsal aspect of the foot at the base of the toes.  There is no tenderness with palpation to the foot.  Pain with dorsi flexion.  DP +2.  Skin:    General: Skin is warm and dry.  Neurological:     General: No focal deficit present.     Mental Status: She is alert and oriented to person, place, and time.  Psychiatric:        Mood and Affect: Mood normal.        Behavior: Behavior normal.      UC Treatments / Results  Labs (all labs ordered are listed, but only abnormal results are displayed) Labs Reviewed - No data to display  EKG   Radiology No results found.  Procedures Procedures (including critical care time)  Medications Ordered in UC Medications - No data to display  Initial Impression / Assessment and Plan / UC Course  I have reviewed the triage vital signs and the nursing notes.  Pertinent labs & imaging results that were available during my care of the patient were reviewed by me and considered in my medical decision making (see chart for details).     Reviewed exam and symptoms with patient, no red flags.  Wet read of ankle/foot x-ray without obvious fracture.  Will contact for any  positive results based on radiology overread once available.  Discussed ankle/foot sprain.   Aircast applied and reviewed RICE therapy.  Patient already has a walker secondary to her recent back surgery.  She has pain medication already prescribed for her back surgery that she can take as prescribed.  Advised PCP follow-up 1 week if symptoms do not improve.  ER precautions reviewed. Final Clinical Impressions(s) / UC Diagnoses   Final diagnoses:  Sprain of right ankle, unspecified ligament, initial encounter  Sprain of right foot, initial encounter     Discharge Instructions      You may elevate and ice the ankle/foot as needed.  You may use the Aircast to the ankle for support and stabilize the joint.  Continue your previously prescribed pain medication as needed.  Please follow-up with your PCP in 1 week if your symptoms do not improve.  Please go to the ER if you develop any worsening symptoms.  Hope you feel better soon!     ED Prescriptions   None    PDMP not reviewed this encounter.   Loreda Myla SAUNDERS, NP 11/03/23 1145

## 2023-11-03 NOTE — ED Triage Notes (Addendum)
 Pt present with rt foot and ankle pain. States she rolled her ankle after being discharged from the hospital. Presents with swelling and states she has pain when applying pressure. Pt states the swelling and pain has worsened.

## 2024-03-28 ENCOUNTER — Ambulatory Visit
Admission: EM | Admit: 2024-03-28 | Discharge: 2024-03-28 | Disposition: A | Discharge status: Home / Self Care | Attending: Family Medicine | Admitting: Family Medicine

## 2024-03-28 DIAGNOSIS — M7918 Myalgia, other site: Secondary | ICD-10-CM

## 2024-03-28 DIAGNOSIS — I10 Essential (primary) hypertension: Secondary | ICD-10-CM | POA: Diagnosis not present

## 2024-03-28 DIAGNOSIS — M62838 Other muscle spasm: Secondary | ICD-10-CM

## 2024-03-28 MED ORDER — LIDOCAINE 5 % EX PTCH: 1.0000 | MEDICATED_PATCH | CUTANEOUS | 0 refills | Status: AC

## 2024-03-28 MED ORDER — CYCLOBENZAPRINE HCL 10 MG PO TABS: 10.0000 mg | ORAL_TABLET | Freq: Two times a day (BID) | ORAL | 0 refills | Status: AC | PRN

## 2024-03-28 MED ORDER — AMLODIPINE BESYLATE 5 MG PO TABS: 5.0000 mg | ORAL_TABLET | Freq: Every day | ORAL | 0 refills | Status: AC

## 2024-03-28 MED ORDER — NAPROXEN 375 MG PO TABS: 375.0000 mg | ORAL_TABLET | Freq: Two times a day (BID) | ORAL | 0 refills | Status: AC | PRN

## 2024-03-28 NOTE — ED Provider Notes (Signed)
 " [facility]    CSN: [id number] Arrival date & time: 03/28/24  1007      History   Chief Complaint Chief Complaint  Patient presents with   Shoulder Pain    HPI Jeanne Waller is a 44 y.o. female presents for shoulder pain.  Patient reports 5 days ago she turned in bed and had an onset of muscle pain/cramping to her left rhomboid overlying her shoulder blade.  States has been persistent since then and keeps her up at night.  Denies any injury such as fall.  No neck pain.  No numbness tingling weakness of her upper extremities.  She has been taking ibuprofen , Tylenol  and a prescription of hydrocodone  with some improvement.  Last dose of ibuprofen  was 4 hours ago.  In addition blood pressure elevated on intake.  Patient reports a history of hypertension currently not on treatment.  She was normally taking losartan but is not currently on it and does have a primary care appointment next week to readdress this.  She denies headache, blurry vision, dizziness, chest pain or shortness of breath.  No other concerns   Shoulder Pain   Past Medical History:  Diagnosis Date   Bipolar 1 disorder (HCC)    Bipolar disorder (HCC)    Endometriosis 2015   s/p ex lap with endometrioma removal   History of chicken pox    PUD (peptic ulcer disease)    controlled with nexium in past    Patient Active Problem List   Diagnosis Date Noted   IUD (intrauterine device) in place 01/09/2016   S/P tubal ligation 02/19/2015   Spontaneous vaginal delivery 02/17/2015   AMA (advanced maternal age) multigravida 35+ 02/16/2015   Positive GBS test 02/16/2015   Rubella non-immune status, antepartum 02/16/2015   PROM (premature rupture of membranes) 02/16/2015   Normal labor 02/16/2015   Female pelvic pain 10/05/2013   Uterine fibroid 10/05/2013   Endometriosis determined by laparoscopy 10/05/2013   Healthcare maintenance 10/30/2010   Bipolar disorder (HCC)    History of tobacco use 02/14/2007    DYSPEPSIA 08/19/2006    Past Surgical History:  Procedure Laterality Date   COLPOSCOPY     abnormal pap - positive HPV   DIAGNOSTIC LAPAROSCOPY  09/2013   for chronic pelvic pain and uterine fibroids   INDUCED ABORTION  2002   LAPAROSCOPIC LYSIS OF ADHESIONS N/A 10/05/2013   Procedure: LAPAROSCOPIC LYSIS OF ADHESIONS;  Surgeon: Shanda SHAUNNA Muscat, MD;  Location: [facility];  Service: Gynecology;  Laterality: N/A;   LAPAROSCOPY N/A 10/05/2013   Procedure: LAPAROSCOPY OPERATIVE;  Surgeon: Shanda SHAUNNA Muscat, MD;  Location: [facility];  Service: Gynecology;  Laterality: N/A;  Please have Harmonic scalpel available   TOTAL LAPAROSCOPIC HYSTERECTOMY WITH SALPINGECTOMY  06/2017   Dr Genette Essex at [facility]   TUBAL LIGATION Bilateral 02/17/2015   Procedure: POST PARTUM TUBAL LIGATION;  Surgeon: Jerolyn Foil, MD;  Location: [facility];  Service: Gynecology;  Laterality: Bilateral;    OB History     Gravida  3   Para  2   Term  2   Preterm      AB  1   Living  1      SAB      IAB      Ectopic      Multiple  0   Live Births  1            Home Medications    Prior to Admission medications  Medication Sig Start Date End Date Taking? Authorizing Provider  ALPRAZolam (XANAX) 1 MG tablet Take 1 tablet by mouth 2 (two) times daily as needed. 01/19/20  Yes [provider]  amLODipine  (NORVASC ) 5 MG tablet Take 1 tablet (5 mg total) by mouth daily. 03/28/24  Yes Layloni Fahrner, Jodi R, NP  cyclobenzaprine  (FLEXERIL ) 10 MG tablet Take 1 tablet (10 mg total) by mouth 2 (two) times daily as needed for muscle spasms. 03/28/24  Yes Altagracia Rone, Jodi R, NP  lamoTRIgine  (LAMICTAL ) 200 MG tablet Take 400 mg by mouth daily.    Yes [provider]  lidocaine  (LIDODERM ) 5 % Place 1 patch onto the skin daily. Leave on affected area for 12 hours and remove for 12 hours 03/28/24  Yes Analicia Skibinski, Jodi R, NP  naproxen  (NAPROSYN ) 375 MG tablet Take 1 tablet (375 mg total) by mouth 2 (two) times daily as needed  (back pain). 03/28/24  Yes Majesta Leichter, Jodi R, NP  Vitamin D, Ergocalciferol, (DRISDOL) 1.25 MG (50000 UNIT) CAPS capsule Take 50,000 Units by mouth once a week.   Yes [provider]  carbamazepine (TEGRETOL) 200 MG tablet Take 600 mg by mouth. 01/19/20   [provider]  losartan-hydrochlorothiazide (HYZAAR) 100-12.5 MG tablet Take 1 tablet by mouth daily. 06/18/23   [provider]  oxyCODONE -acetaminophen  (PERCOCET/ROXICET) 5-325 MG tablet Take 1 tablet by mouth every 4 (four) hours as needed (for pain scale 4-7). 02/19/15   Verta Blossom, [name]  predniSONE  (DELTASONE ) 20 MG tablet Take 2 tablets daily with breakfast. 09/06/23   Christopher Savannah, PA-C  QUEtiapine  (SEROQUEL ) 200 MG tablet Take 200 mg by mouth at bedtime.    [provider]    Family History Family History  Problem Relation Age of Onset   Hypertension Sister    Diabetes Maternal Aunt    Heart disease Maternal Uncle    Diabetes Maternal Grandmother    Cancer Maternal Grandmother        cervical cancer   Heart disease Maternal Grandmother    Stroke Neg Hx    Coronary artery disease Neg Hx     Social History Social History[1]   Allergies   Lisinopril   Review of Systems Review of Systems  Musculoskeletal:        Left posterior shoulder/rhomboid muscle spasm     Physical Exam Triage Vital Signs ED Triage Vitals  Encounter Vitals Group     BP 03/28/24 1118 (!) 182/141     Girls Systolic BP Percentile --      Girls Diastolic BP Percentile --      Boys Systolic BP Percentile --      Boys Diastolic BP Percentile --      Pulse Rate 03/28/24 1118 67     Resp 03/28/24 1118 16     Temp 03/28/24 1118 98.2 F (36.8 C)     Temp Source 03/28/24 1118 Oral     SpO2 03/28/24 1118 97 %     Weight --      Height --      Head Circumference --      Peak Flow --      Pain Score 03/28/24 1117 7     Pain Loc --      Pain Education --      Exclude from Growth Chart --    No data  found.  Updated Vital Signs BP (!) 169/123   Pulse 67   Temp 98.2 F (36.8 C) (Oral)   Resp 16  LMP 09/12/2013   SpO2 97%   Visual Acuity Right Eye Distance:   Left Eye Distance:   Bilateral Distance:    Right Eye Near:   Left Eye Near:    Bilateral Near:     Physical Exam Vitals and nursing note reviewed.  Constitutional:      General: She is not in acute distress.    Appearance: Normal appearance. She is not ill-appearing.  HENT:     Head: Normocephalic and atraumatic.  Eyes:     Pupils: Pupils are equal, round, and reactive to light.  Cardiovascular:     Rate and Rhythm: Normal rate.  Pulmonary:     Effort: Pulmonary effort is normal.  Musculoskeletal:       Arms:     Comments: No swelling of the left posterior shoulder.  Tender to palpation over the rhomboid.  Skin:    General: Skin is warm and dry.  Neurological:     General: No focal deficit present.     Mental Status: She is alert and oriented to person, place, and time.  Psychiatric:        Mood and Affect: Mood normal.        Behavior: Behavior normal.      UC Treatments / Results  Labs (all labs ordered are listed, but only abnormal results are displayed) Labs Reviewed - No data to display  EKG   Radiology No results found.  Procedures Procedures (including critical care time)  Medications Ordered in UC Medications - No data to display  Initial Impression / Assessment and Plan / UC Course  I have reviewed the triage vital signs and the nursing notes.  Pertinent labs & imaging results that were available during my care of the patient were reviewed by me and considered in my medical decision making (see chart for details).     Reviewed exam and symptoms with patient.  Discussed muscle spasm, will do trial of Flexeril , Lidoderm  patch and naproxen .  No ketorolac  as patient took ibuprofen  4 hours ago.  Continue heat as needed.  Discussed blood pressure, will do trial of 5 mg of Norvasc   and advised her to continue keeping a BP log and she will follow-up with her PCP at her scheduled appointment, January 27.  ER precautions reviewed and patient verbalized understanding Final Clinical Impressions(s) / UC Diagnoses   Final diagnoses:  Rhomboid muscle pain  Muscle spasm  Essential hypertension     Discharge Instructions      Start amlodipine  5 mg daily for your blood pressure.  Continue to keep your blood pressure log checking twice daily and take this to your primary care at your scheduled appointment next week.  May take Flexeril  twice daily as needed.  Please note this is a muscle relaxer will make you drowsy.  Do not drink alcohol or drive on this medication.  We do the Lidoderm  patch to the left shoulder as needed.  Leave on for 12 hours and remove for 12 hours continue heat.  May take naproxen  twice daily as needed as well.  Lots of rest and follow-up with your PCP if send appointment next week.  Please go to the ER for any worsening symptoms.  Hope you feel better soon!    ED Prescriptions     Medication Sig Dispense Auth. Provider   cyclobenzaprine  (FLEXERIL ) 10 MG tablet Take 1 tablet (10 mg total) by mouth 2 (two) times daily as needed for muscle spasms. 12 tablet Jaime Dome, Jodi R,  NP   naproxen  (NAPROSYN ) 375 MG tablet Take 1 tablet (375 mg total) by mouth 2 (two) times daily as needed (back pain). 14 tablet Kylei Purington, Jodi R, NP   lidocaine  (LIDODERM ) 5 % Place 1 patch onto the skin daily. Leave on affected area for 12 hours and remove for 12 hours 10 patch Rusty Glodowski, Jodi R, NP   amLODipine  (NORVASC ) 5 MG tablet Take 1 tablet (5 mg total) by mouth daily. 30 tablet Nayanna Seaborn, Jodi R, NP      PDMP not reviewed this encounter.    [1]  Social History Tobacco Use   Smoking status: Former    Current packs/day: 0.00    Types: Cigarettes    Quit date: 03/10/2007    Years since quitting: 17.0   Smokeless tobacco: Never  Vaping Use   Vaping status: Never Used  Substance Use  Topics   Alcohol use: Yes    Comment: rare   Drug use: No     Loreda Myla SAUNDERS, NP 03/28/24 1153  "

## 2024-03-28 NOTE — ED Triage Notes (Signed)
 Pt present with c/o localized lt shoulder pain x 5 days.  Pt states it hurts to rotate it. Pt states it worsened last night.  Pt states she has taken Ibuprofen  and Tylenol . Pt has taken Hydrocodone , last dose was 2 days ago.

## 2024-03-28 NOTE — Discharge Instructions (Addendum)
 Start amlodipine  5 mg daily for your blood pressure.  Continue to keep your blood pressure log checking twice daily and take this to your primary care at your scheduled appointment next week.  May take Flexeril  twice daily as needed.  Please note this is a muscle relaxer will make you drowsy.  Do not drink alcohol or drive on this medication.  We do the Lidoderm  patch to the left shoulder as needed.  Leave on for 12 hours and remove for 12 hours continue heat.  May take naproxen  twice daily as needed as well.  Lots of rest and follow-up with your PCP if send appointment next week.  Please go to the ER for any worsening symptoms.  Hope you feel better soon!
